# Patient Record
Sex: Female | Born: 1990 | Race: White | Hispanic: No | Marital: Single | Smoking: Former smoker
Health system: Southern US, Community
[De-identification: ages and names within clinical notes are randomized; demographics above are authoritative.]

## PROBLEM LIST (undated history)

## (undated) ENCOUNTER — Inpatient Hospital Stay (HOSPITAL_COMMUNITY): Payer: Self-pay

## (undated) DIAGNOSIS — F32A Depression, unspecified: Secondary | ICD-10-CM

## (undated) DIAGNOSIS — R519 Headache, unspecified: Secondary | ICD-10-CM

## (undated) DIAGNOSIS — O139 Gestational [pregnancy-induced] hypertension without significant proteinuria, unspecified trimester: Secondary | ICD-10-CM

## (undated) DIAGNOSIS — R51 Headache: Secondary | ICD-10-CM

## (undated) DIAGNOSIS — Z789 Other specified health status: Secondary | ICD-10-CM

## (undated) DIAGNOSIS — F329 Major depressive disorder, single episode, unspecified: Secondary | ICD-10-CM

## (undated) DIAGNOSIS — F419 Anxiety disorder, unspecified: Secondary | ICD-10-CM

## (undated) HISTORY — PX: WISDOM TOOTH EXTRACTION: SHX21

## (undated) HISTORY — PX: OTHER SURGICAL HISTORY: SHX169

## (undated) HISTORY — PX: INCISE AND DRAIN ABCESS: PRO64

## (undated) HISTORY — PX: DILATION AND CURETTAGE OF UTERUS: SHX78

## (undated) HISTORY — PX: ADENOIDECTOMY: SUR15

---
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
## 2011-01-08 ENCOUNTER — Encounter (INDEPENDENT_AMBULATORY_CARE_PROVIDER_SITE_OTHER): Payer: Medicaid Other | Admitting: Obstetrics & Gynecology

## 2011-01-08 DIAGNOSIS — E669 Obesity, unspecified: Secondary | ICD-10-CM

## 2011-01-08 DIAGNOSIS — Z3480 Encounter for supervision of other normal pregnancy, unspecified trimester: Secondary | ICD-10-CM

## 2011-01-15 ENCOUNTER — Encounter (INDEPENDENT_AMBULATORY_CARE_PROVIDER_SITE_OTHER): Payer: Medicaid Other | Admitting: Obstetrics and Gynecology

## 2011-01-15 DIAGNOSIS — Z3480 Encounter for supervision of other normal pregnancy, unspecified trimester: Secondary | ICD-10-CM

## 2011-01-21 ENCOUNTER — Encounter (INDEPENDENT_AMBULATORY_CARE_PROVIDER_SITE_OTHER): Payer: Medicaid Other | Admitting: Obstetrics & Gynecology

## 2011-01-21 DIAGNOSIS — E669 Obesity, unspecified: Secondary | ICD-10-CM

## 2011-01-21 DIAGNOSIS — Z3480 Encounter for supervision of other normal pregnancy, unspecified trimester: Secondary | ICD-10-CM

## 2011-01-24 ENCOUNTER — Inpatient Hospital Stay (HOSPITAL_COMMUNITY)
Admission: AD | Admit: 2011-01-24 | Discharge: 2011-01-24 | Disposition: A | Discharge status: Home / Self Care | Payer: Medicaid Other | Source: Ambulatory Visit | Attending: Obstetrics & Gynecology | Admitting: Obstetrics & Gynecology

## 2011-01-24 DIAGNOSIS — O479 False labor, unspecified: Secondary | ICD-10-CM | POA: Insufficient documentation

## 2011-01-25 ENCOUNTER — Inpatient Hospital Stay (HOSPITAL_COMMUNITY)
Admission: AD | Admit: 2011-01-25 | Discharge: 2011-01-25 | Disposition: A | Discharge status: Home / Self Care | Payer: Medicaid Other | Source: Ambulatory Visit | Attending: Obstetrics & Gynecology | Admitting: Obstetrics & Gynecology

## 2011-01-25 DIAGNOSIS — O479 False labor, unspecified: Secondary | ICD-10-CM | POA: Insufficient documentation

## 2011-01-26 ENCOUNTER — Encounter: Payer: Self-pay | Admitting: Obstetrics & Gynecology

## 2011-01-26 ENCOUNTER — Inpatient Hospital Stay (HOSPITAL_COMMUNITY)
Admission: AD | Admit: 2011-01-26 | Discharge: 2011-01-29 | DRG: 766 | Disposition: A | Discharge status: Home / Self Care | Payer: Medicaid Other | Source: Ambulatory Visit | Attending: Obstetrics & Gynecology | Admitting: Obstetrics & Gynecology

## 2011-01-26 ENCOUNTER — Other Ambulatory Visit: Payer: Self-pay | Admitting: Obstetrics & Gynecology

## 2011-01-26 LAB — CBC
HCT: 38.1 % (ref 36.0–46.0)
Hemoglobin: 12.8 g/dL (ref 12.0–15.0)
MCH: 29 pg (ref 26.0–34.0)
MCHC: 33.6 g/dL (ref 30.0–36.0)
MCV: 86.4 fL (ref 78.0–100.0)
Platelets: 338 10*3/uL (ref 150–400)
RBC: 4.41 MIL/uL (ref 3.87–5.11)

## 2011-01-26 LAB — RPR: RPR Ser Ql: NONREACTIVE

## 2011-01-27 LAB — CBC: MCV: 87.3 fL (ref 78.0–100.0)

## 2011-01-29 ENCOUNTER — Encounter: Payer: Medicaid Other | Admitting: Obstetrics & Gynecology

## 2011-01-29 NOTE — Op Note (Signed)
Caitlyn Galvan, Caitlyn Galvan         ACCOUNT NO.:  [id number]  MEDICAL RECORD NO.:  [id number]  LOCATION:  9102                          FACILITY:  [facility]  PHYSICIAN:  Lazaro Arms, M.D.   DATE OF BIRTH:  1991/07/08  DATE OF PROCEDURE:  01/26/2011 DATE OF DISCHARGE:                              OPERATIVE REPORT   PREOPERATIVE DIAGNOSES: 1. Intrauterine pregnancy at 39-6/7 weeks' gestation. 2. Secondary arrest of descend and dilatation in the first stage of     labor.  POSTOPERATIVE DIAGNOSES: 1. Intrauterine pregnancy at 39-6/7 weeks' gestation. 2. Secondary arrest of descend and dilatation in the first stage of     labor.  PROCEDURE:  Primary low transverse cesarean resection.  SURGEON:  Lazaro Arms, MD.  ANESTHESIA:  Epidural.  FINDINGS:  The patient had been essentially 5 cm for the past 4 hours by adequate labor.  Caitlyn Galvan called me at 1600 said she was 5, 100, -1 to - 2,  and she just got an adequate labor, probably about an hour so prior. I wanted to give her another couple of hours to give her good chance since the baby was stable.  We have told her re-notify me in 2 hours if cervical change.  At 1800 she called me and said there had been no appreciable cervical change and unfortunately she was then placed on her back to be examined.  The fetal heart rate came down to 60s and 70s that responded with some oxygen and changing position, however, her cervix had indeed not changed and she was 5, 100, about -2 and had a lot of caput and was asynclitic as a result I decided to proceed with primary low transverse cesarean resection.  We delivered a viable female at 32 with Apgars of 8 and 9 weighing 7 pounds 15 ounces.  Cord pH 7.22.  Uterus, tubes, and ovaries were normal.  DESCRIPTION OF OPERATION:  The patient was taken to the operating room, placed in the supine position.  Her epidural dosed up.  She was prepped and draped in usual sterile fashion.  A Foley was  already in place.  A Pfannenstiel skin incision was made and carried down sharply to the rectus fascia which was scored in the midline and extended laterally. The fascia was taken off the muscle superiorly and inferiorly without difficulty.  The muscles were divided.  Peritoneal cavity was entered. Bladder blade was placed. A low-transverse hysterotomy incision was made and over this incision was delivered a viable female infant at 69 with Apgars of  8 and 9 weighing 7 pounds 15 ounces.  Cord pH 7.22.  The infant underwent routine neonatal resuscitation by Dr. __________. Placenta was delivered spontaneously.  The uterus was exteriorized, wiped clean with a lap pad and closed in two layers first being running interlocking layer.  The second being imbricating layer with good hemostasis.  The uterus was replaced in peritoneal cavity was irrigated vigorously.  All pedicles hemostatic.  The muscles and peritoneum reapproximated loosely.  The fascia closed using 0 Vicryl running subcutaneous tissues made hemostatic and irrigated.  Skin was closed using 4-0 Vicryl in a subcuticular fashion and Dermabond.  The patient tolerated well.  She experienced 800  mL of blood loss, taken to recovery room in good stable condition.  All counts were correct x3.  She received Ancef prophylactically.     Lazaro Arms, M.D.     Loraine Maple  D:  01/26/2011  T:  01/27/2011  Job:  [id number]  Electronically Signed by Duane Lope M.D. on 01/29/2011 02:33:55 PM

## 2011-02-05 NOTE — Discharge Summary (Signed)
NAMERIVERLYN, Caitlyn Galvan         ACCOUNT NO.:  [id number]  MEDICAL RECORD NO.:  [id number]  LOCATION:  9102                          FACILITY:  [facility]  PHYSICIAN:  [name], MD        DATE OF BIRTH:  [birth year]  DATE OF ADMISSION:  01/26/2011 DATE OF DISCHARGE:  01/29/2011                              DISCHARGE SUMMARY   PRIMARY OBSTETRICAL CENTER:  [facility].  ADMISSION DIAGNOSES: 1. Intrauterine pregnancy at 19 and 6 weeks. 2. Spontaneous rupture of membranes. 3. Young maternal age. 4. Obesity. 5. History of Chlamydia.  DISCHARGE DIAGNOSES: 1. Intrauterine pregnancy at 10 and [redacted] weeks gestation. 2. Status post primary low transverse cesarean section for secondary     arrest of descent and dilation in the first stage of labor. 3. Young maternal age. 4. Obesity. 5. History of Chlamydia with this pregnancy.  DISCHARGE MEDICATIONS: 1. Motrin 600-800 mg p.o. q.6-8 h. p.r.n. 2. FeSO4 325 mg p.o. b.i.d. 3. Prenatal vitamin 1 tablet p.o. daily. 4. Colace 50-100 mg p.o. b.i.d. p.r.n. constipation.  CONSULTATIONS:  None.  PROCEDURES: 1. Primary low transverse cesarean section on January 26, 2011 for     secondary arrest of descent and dilation in the first stage of     labor.  The patient dilated to approximately 5 cm with adequate     labor and contractions, however, was not able to dilate past this.     At 1902, a viable female infant with Apgars of 8 and 9, weighing 7     pounds 15 ounces was delivered via C-section.  Cord pH was 7.22.     Uterus, tubes, and ovaries were all normal.  The fascia was closed     using 0-Vicryl and skin was closed using 4-0 Vicryl.  Dermabond was     used.  No staples were needed.  The patient tolerated the procedure     well.  Estimated blood loss was approximately 800 mL.  The patient     was taken to the recovery room in stable condition.  LABORATORY DATA:  On admission, the patient's CBC showed a hemoglobin of 12.8 and hematocrit 38.1.   Postop day #1, hemoglobin of 10.3 and hematocrit of 31.0.  The patient was RPR nonreactive.  BRIEF HOSPITAL COURSE:  This is a 20 year old now G1, P 1-0-0-1, status post primary low transverse cesarean section for secondary arrest of descent and dilation in the first stage of labor.  The patient was admitted for spontaneous rupture of membranes.  Pitocin was started as the patient was not contracting regularly despite adequate contractions as measured by an IUPC.  The patient could not dilate past 5 cm. Despite adequate labor, the patient was given time to dilate, however, after multiple hours of remaining 5 cm without any appreciable cervical change.  Fetal heart rate became bradycardic to the 60s-70s.  Heart rate responded with some oxygen and position change, however, since cervix had not changed and fetus had a lot of caput and asynclitic.  Primary low transverse cesarean section was performed.  A viable female infant at 7 pounds 15 ounces with Apgars of 8 and 9 was born at 42 with a cord pH of  7.22.  Mother and baby were stable and quickly transferred from the OR to PACU and then mother to baby.  Mother remained stable throughout hospital course and was doing well.  By postop day #3, she was ambulating, able to eat and drink, and had a bowel movement and was voiding spontaneously.  In addition, she had decreased lochia and pain was well-controlled with Motrin only.  DISCHARGE INSTRUCTIONS:  The patient was instructed to continue a regular diet and to follow instruction booklet for wound care, activity, and sexual activity.  The patient is to follow up at [facility] in 6 weeks for routine postpartum visit.  At that time, she will have Mirena IUD placed.  The patient was breast-feeding well at the time of discharge.  They will get an outpatient circumcision for the infant.  DISCHARGE CONDITION:  Mother and baby were discharged to home in stable medical  condition.    ______________________________ Caitlyn Pore, MD   ______________________________ [name], MD    JM/MEDQ  D:  01/29/2011  T:  01/29/2011  Job:  [id number]  Electronically Signed by Caitlyn Pore MD on 01/30/2011 01:39:35 PM Electronically Signed by [name] MD on 02/05/2011 08:04:07 AM

## 2011-03-04 ENCOUNTER — Encounter: Payer: Self-pay | Admitting: Family Medicine

## 2011-03-04 ENCOUNTER — Inpatient Hospital Stay (HOSPITAL_COMMUNITY)
Admission: AD | Admit: 2011-03-04 | Discharge: 2011-03-04 | Disposition: A | Discharge status: Home / Self Care | Payer: Medicaid Other | Source: Ambulatory Visit | Attending: Family Medicine | Admitting: Family Medicine

## 2011-03-04 DIAGNOSIS — N898 Other specified noninflammatory disorders of vagina: Secondary | ICD-10-CM

## 2011-03-04 DIAGNOSIS — N939 Abnormal uterine and vaginal bleeding, unspecified: Secondary | ICD-10-CM

## 2011-03-04 DIAGNOSIS — O26899 Other specified pregnancy related conditions, unspecified trimester: Secondary | ICD-10-CM | POA: Insufficient documentation

## 2011-03-04 HISTORY — DX: Other specified health status: Z78.9

## 2011-03-04 LAB — URINALYSIS, ROUTINE W REFLEX MICROSCOPIC
Glucose, UA: NEGATIVE mg/dL
Ketones, ur: NEGATIVE mg/dL
Leukocytes, UA: NEGATIVE
Nitrite: NEGATIVE
Specific Gravity, Urine: >1.03 — ABNORMAL HIGH (ref 1.005–1.030)

## 2011-03-04 LAB — URINE MICROSCOPIC-ADD ON

## 2011-03-04 LAB — CBC
Hemoglobin: 10.8 g/dL — ABNORMAL LOW (ref 12.0–15.0)
MCHC: 33 g/dL (ref 30.0–36.0)
MCV: 84.9 fL (ref 78.0–100.0)
Platelets: 323 10*3/uL (ref 150–400)
RBC: 3.85 MIL/uL — ABNORMAL LOW (ref 3.87–5.11)
RDW: 14.1 % (ref 11.5–15.5)
WBC: 12.2 10*3/uL — ABNORMAL HIGH (ref 4.0–10.5)

## 2011-03-04 MED ORDER — FERROUS SULFATE 325 (65 FE) MG PO TABS: 325.0000 mg | ORAL_TABLET | Freq: Two times a day (BID) | ORAL | Status: DC

## 2011-03-04 MED ORDER — NAPROXEN 500 MG PO TABS
500.0000 mg | ORAL_TABLET | Freq: Two times a day (BID) | ORAL | Status: AC | PRN
Start: 2011-03-04 — End: 2012-03-17

## 2011-03-04 NOTE — Progress Notes (Signed)
Pt states vaginal delivery 01/26/2011, lightened up postpartum, then restarted 2 days ago heavy. Changing pad 1-2x per hour, in triage scant amt on pad, however did just change 20 minutes ago. Deneis pain at present, no abnormal vaginal d/c. Did have intercourse 2-3 weeks ago.

## 2011-03-04 NOTE — ED Provider Notes (Addendum)
Chief Complaint:  Vaginal Bleeding   Caitlyn Galvan is  20 y.o. G1P1001 s/p PLTCS 01/26/11 due to FTP presenting with complaint of vaginal bleeding.  Patient's last menstrual period was 03/01/2011.[name]  Her pregnancy status is unknown/currently postpartum.  She presents complaining of Vaginal Bleeding  Pt states bleeding stopped after the delivery but then returned heavier, +clots x2 days.  No fever, chills, +nausea, no vomiting.  No incisional complaints.  +intercourse 2 weeks ago +condoms. Bottlefeeding.  No constipation.  Onset is described as sudden and has been present for  2 days.  Obstetrical/Gynecological History: OB History    Grav Para Term Preterm Abortions TAB SAB Ect Mult Living   1 1 1  0 0 0 0 0 0 1      Past Medical History: History reviewed. No pertinent past medical history.  Past Surgical History: Past Surgical History  Procedure Date  . Cesarean section     Family History: No family history on file.  Social History: History  Substance Use Topics  . Smoking status: Former Games developer  . Smokeless tobacco: Not on file  . Alcohol Use: No    Allergies: No Known Allergies  Prescriptions prior to admission  Medication Sig Dispense Refill  . ferrous sulfate 325 (65 FE) MG tablet Take 325 mg by mouth daily with breakfast.          Review of Systems - Negative except per HPI  Physical Exam   Blood pressure 120/70, pulse 87, temperature 98.1 F (36.7 C), temperature source Oral, resp. rate 16, height 5\' 4"  (1.626 m), weight 107.162 kg (236 lb 4 oz), last menstrual period 03/01/2011, unknown if currently breastfeeding.  General: General appearance - alert, well appearing, and in no distress Abdomen - soft, nontender, nondistended, no masses or organomegaly Incision well healed, c/d/i Focused Gynecological Exam: normal external genitalia, vulva, vagina, cervix, uterus and adnexa, blood in the vault, cervix appears closed, clots occasional, mild adnexal  tenderness to palpation, no uterine tenderness, difficult to palpate due to size.   Labs: Recent Results (from the past 24 hour(s))  URINALYSIS, ROUTINE W REFLEX MICROSCOPIC   Collection Time   03/04/11  5:27 PM      Component Value Range   Color, Urine YELLOW  YELLOW    Appearance HAZY (*) CLEAR    Specific Gravity, Urine >1.030 (*) 1.005 - 1.030    pH 5.5  5.0 - 8.0    Glucose, UA NEGATIVE  NEGATIVE (mg/dL)   Hgb urine dipstick LARGE (*) NEGATIVE    Bilirubin Urine NEGATIVE  NEGATIVE    Ketones, ur NEGATIVE  NEGATIVE (mg/dL)   Protein, ur NEGATIVE  NEGATIVE (mg/dL)   Urobilinogen, UA 0.2  0.0 - 1.0 (mg/dL)   Nitrite NEGATIVE  NEGATIVE    Leukocytes, UA NEGATIVE  NEGATIVE   PREGNANCY, URINE   Collection Time   03/04/11  5:27 PM      Component Value Range   Preg Test, Ur NEGATIVE    URINE MICROSCOPIC-ADD ON   Collection Time   03/04/11  5:27 PM      Component Value Range   Squamous Epithelial / LPF RARE  RARE    WBC, UA 0-2  <3 (WBC/hpf)   RBC / HPF TOO NUMEROUS TO COUNT  <3 (RBC/hpf)   Bacteria, UA FEW (*) RARE   CBC   Collection Time   03/04/11  5:52 PM      Component Value Range   WBC 12.2 (*) 4.0 - 10.5 (K/uL)  RBC 3.85 (*) 3.87 - 5.11 (MIL/uL)   Hemoglobin 10.8 (*) 12.0 - 15.0 (g/dL)   HCT 45.4 (*) 09.8 - 46.0 (%)   MCV 84.9  78.0 - 100.0 (fL)   MCH 28.1  26.0 - 34.0 (pg)   MCHC 33.0  30.0 - 36.0 (g/dL)   RDW 11.9  14.7 - 82.9 (%)   Platelets 323  150 - 400 (K/uL)  POCT PREGNANCY, URINE   Collection Time   03/04/11  6:05 PM      Component Value Range   Preg Test, Ur NEGATIVE     Imaging Studies:  No results found.   Assessment: 1. Vaginal bleeding likely normal menses.    Plan: 1. Labs reviewed. UPT  Neg, CBC stable continue po iron.  Start prn NSAIDS.  Pp contraception of an IUD at 6 week eval.  D/c home.   Richa Shor

## 2011-03-11 ENCOUNTER — Encounter: Payer: Self-pay | Admitting: Obstetrics and Gynecology

## 2011-03-11 ENCOUNTER — Ambulatory Visit (INDEPENDENT_AMBULATORY_CARE_PROVIDER_SITE_OTHER): Payer: Medicaid Other | Admitting: Obstetrics and Gynecology

## 2011-03-11 DIAGNOSIS — Z309 Encounter for contraceptive management, unspecified: Secondary | ICD-10-CM

## 2011-03-11 NOTE — Progress Notes (Signed)
Patient bled the entire post partum period.  She has stopped bleeding now.  Would like to have Mirena IUD placed for birth control.

## 2011-03-11 NOTE — Progress Notes (Signed)
  Subjective:    Patient ID: Caitlyn Galvan, female    DOB: [birth year], 20 y.o.   MRN: [id number]  HPI 20yo G1P1001 s/p primary LTCS on 6/24 secondary to failure to progress presenting today for postpartum visit. Patient is currently without any complaints. Reports receiving ample support and help from partner and denies si/sx of postpartum depression. Patient has initiated protected sexual activity and is interested in Mirena IUD for birth control. Patient is bottle feeding and reports infant is thriving. Patient reports persistent vaginal bleeding since giving birth which stopped yesterday. Patient states that initially it was heavy with clots (patient seen in MAU and told normal exam) but later had the appearance of a period. Patient is otherwise without any complaints.   Review of Systems  All other systems reviewed and are negative.       Objective:   Physical Exam GENERAL: Well-developed, well-nourished female in no acute distress.  HEENT: Normocephalic, atraumatic. Sclerae anicteric.  NECK: Supple. Normal thyroid.  LUNGS: Clear to auscultation bilaterally.  HEART: Regular rate and rhythm. BREASTS: Symmetric in size. No palpable masses or lymphadenopathy, no breast engorgement. ABDOMEN: Soft, nontender, nondistended. No organomegaly. Incision healed completely PELVIC: Normal external female genitalia. Vagina is pink and rugated.  Normal discharge. Normal appearing cervix. No abnormal bleeding. Uterus is normal in size.  No adnexal mass or tenderness. EXTREMITIES: No cyanosis, clubbing, or edema, 2+ distal pulses.       Assessment & Plan:  20yo G1P1001 s/p LTCS on 6/24 here for annual exam - Patient is medically cleared to resume all activities of daily living. - Patient to schedule Mirena IUD in a sertion appointment in the coming days. Patient to abstain from unprotected intercourse until insertion. - Patient to monitor vaginal bleeding

## 2011-03-11 NOTE — Patient Instructions (Signed)
  Place postpartum visit patient instructions here.  

## 2011-03-25 ENCOUNTER — Ambulatory Visit (INDEPENDENT_AMBULATORY_CARE_PROVIDER_SITE_OTHER): Payer: Medicaid Other | Admitting: Obstetrics & Gynecology

## 2011-03-25 ENCOUNTER — Encounter: Payer: Self-pay | Admitting: Obstetrics & Gynecology

## 2011-03-25 VITALS — BP 135/78 | HR 96 | Wt 243.0 lb

## 2011-03-25 DIAGNOSIS — Z30430 Encounter for insertion of intrauterine contraceptive device: Secondary | ICD-10-CM

## 2011-03-25 DIAGNOSIS — Z309 Encounter for contraceptive management, unspecified: Secondary | ICD-10-CM

## 2011-03-25 NOTE — Progress Notes (Signed)
  20yo G1P1001 s/p primary LTCS on 6/24 secondary to failure to progress presenting today for postpartum IUD insertion.  Had vaginal delivery.  No complaints.  IUD Procedure Note Patient identified, informed consent performed, signed copy in chart, time out was performed.  Speculum placed in the vagina.  Cervix visualized.  Cleaned with Betadine x 2.  Grasped anteriorly with a single tooth tenaculum.  Uterus sounded to 7.5 cm.  Mirena IUD placed per manufacturer's recommendations.  Strings trimmed to 3 cm. Tenaculum was removed, good hemostasis noted.  Patient tolerated procedure well.   Patient given post procedure instructions and Mirena care card with expiration date.  Patient is asked to check IUD strings periodically and follow up in 4-6 weeks for IUD check.  Caitlyn Galvan A 03/25/2011 11:29 AM

## 2011-03-25 NOTE — Patient Instructions (Signed)
IMPORTANT: HOW TO USE THIS INFORMATION:  This is a summary and does NOT have all possible information about this product. This information does not assure that this product is safe, effective, or appropriate for you. This information is not individual medical advice and does not substitute for the advice of your health care professional. Always ask your health care professional for complete information about this product and your specific health needs.    LEVONORGESTREL-RELEASING IMPLANT - INTRAUTERINE (lee-voh-nor-JEST-rell)    COMMON BRAND NAME(S): Mirena    USES:  This product is a small, flexible device that is placed in the womb (uterus) to prevent pregnancy. It is used in women who desire reversible birth control that works for a long time (up to 5 years). The device works by slowly releasing a hormone (levonorgestrel) that is similar to a certain substance made by a woman's body. This product is only intended for women who have previously given birth and have only one sexual partner. It is not meant for women with a history of certain infections/conditions (e.g., pelvic inflammatory disease, sexually transmitted disease, a certain problem pregnancy called ectopic pregnancy). For more information, consult your doctor. The use of this medication device does not protect you or your partner against sexually transmitted diseases (e.g., HIV, gonorrhea). Carefully read all of the information provided by your doctor, and ask any questions you may have about this product or other birth control methods that may be right for you.    HOW TO USE:  Read the Patient Information Leaflet provided by your pharmacist before this medication device is inserted and each time it is re-inserted. The leaflet contains very important information about side effects and when it is important to call your doctor. If you have any questions, consult your doctor or pharmacist. This product is inserted into your uterus by a properly  trained health care professional, usually once every 5 years or as determined by your doctor. The medication in the device is slowly released into the body over a 5-year period. Have a follow-up appointment 4-12 weeks after insertion of this product to check that it is still correctly in place. If you still desire birth control after 5 years, the medication device may be replaced with a new one. The medication device may also be removed at any time by a properly trained health care professional. Learn all the instructions on how and when to check this product and its proper positioning in your body, and make sure you understand the problems that may occur with this product. See also Precautions section.    SIDE EFFECTS:  Irregular vaginal bleeding (e.g., spotting), cramps, headache, nausea, breast pain, acne, rash, hair loss, weight gain, or decreased interest in sex may occur. If any of these effects persist or worsen, tell your doctor promptly. Remember that your doctor has prescribed this medication device because he or she has judged that the benefit to you is greater than the risk of side effects. Many people using this medication device do not have serious side effects. Tell your doctor immediately if any of these serious side effects occur: lack of menstrual period, unexplained fever, chills, trouble breathing, mental/mood changes (e.g., depression, nervousness), vaginal swelling/itching, painful intercourse. Tell your doctor immediately if any of these unlikely but serious side effects occur: migraine/severe headache, vomiting, tiredness, fast/pounding heartbeat. Tell your doctor immediately if any of these highly unlikely but very serious side effects occur: prolonged or heavy vaginal bleeding, unusual vaginal discharge/odor, vaginal sores, abdominal/pelvic pain or  tenderness, lumps in the breast, yellowing eyes/skin, dark urine, persistent nausea, trouble urinating. A very serious allergic reaction to  this drug is rare. However, seek immediate medical attention if you notice any of the following symptoms of a serious allergic reaction: rash, itching/swelling (especially of the face/tongue/throat), severe dizziness, trouble breathing. This is not a complete list of possible side effects. If you notice other effects not listed above, contact your doctor or pharmacist. In the [place] - Call your doctor for medical advice about side effects. You may report side effects to FDA at 1-800-FDA-1088. In [place] - Call your doctor for medical advice about side effects. You may report side effects to [facility] [place] at [phone].    PRECAUTIONS:  Before using this medication device, tell your doctor or pharmacist if you are allergic to levonorgestrel, or to any other progestins (e.g., norethindrone, desogestrel); or if you have any other allergies. This product may contain inactive ingredients, which can cause allergic reactions or other problems. Talk to your pharmacist for more details. This medication device should not be used if you have certain medical conditions. Before using this product, consult your doctor or pharmacist if you have: current known or suspected pregnancy, previous ectopic pregnancy, uterus problems (e.g., cancer, endometriosis, fibroids, pelvic inflammatory disease-PID), other IUD (intrauterine device) still in place, vaginal problems (e.g., infection), breast cancer, liver disease/tumors, any condition that affects your immune system (e.g., AIDS, leukemia). Before using this product, tell your doctor your medical history, especially of: bleeding problems (e.g., menstrual changes, clotting problems), heart problems (e.g., congenital valve conditions), high blood pressure, migraine headaches, stroke, diabetes. If you have diabetes, this medication may make it harder to control your blood sugar levels. Monitor your blood sugar regularly as directed by your doctor. Tell your doctor the results and any  symptoms such as increased thirst/urination. Your anti-diabetic medication or diet may need to be adjusted. This medication device may sometimes come out by itself or move out of place. This may result in unwanted pregnancy or other problems. After each menstrual period, check to make sure it is in the right place. Talk to your doctor about how to check your device. If it comes out or you cannot feel its threads, call your doctor promptly, and use a backup birth control method such as condoms. If you or partner has any other sexual partners, this medication device may no longer be a good choice for pregnancy prevention. If you or your partner becomes HIV positive, or if you think you may have been exposed to any sexually transmitted disease, contact your doctor immediately. You should consider having this device removed. This medication device must not be used during pregnancy. If you become pregnant or think you may be pregnant, tell your doctor immediately. If you have just given birth and are not breast-feeding, or if you have had a pregnancy loss or abortion after the 3 months of pregnancy, wait at least 6 weeks (or as directed by your doctor) before using this medication device. Consult your doctor about the problems that may occur during pregnancy while using this product. Levonorgestrel passes into breast milk. Consult your doctor before breast-feeding.    DRUG INTERACTIONS:  Your doctor or pharmacist may already be aware of any possible drug interactions and may be monitoring you for them. Do not start, stop, or change the dosage of any medicine before checking with them first. Before using this medication device, tell your doctor of all prescription and nonprescription medications you may use, especially  of: "blood thinners" (e.g., warfarin), birth control taken by mouth or applied to the skin (patch), certain drug used for varicose vein treatment (sodium tetradecyl sulfate), drugs that affect your immune  response (e.g., corticosteroids such as prednisone). This document does not contain all possible interactions. Therefore, before using this product, tell your doctor or pharmacist of all the products you use. Keep a list of all your medications with you, and share the list with your doctor and pharmacist.    OVERDOSE:  Overdose with this medication is very unlikely because of the way the drug is released from this device. Consult your doctor or pharmacist for more information.    NOTES:  Do not share this medication with others. Keep all appointments with your doctor and the laboratory. You should have regular complete physical exams including blood pressure, breast exam, pelvic exam, and screening for cervical cancer (Pap smear). Follow your doctor's instructions for examining your own breasts, and report any lumps immediately. Consult your doctor for more details.    MISSED DOSE:  Not applicable.    STORAGE:  Before use, store at room temperature at 77 degrees F (25 degrees C) away from light and moisture. Brief storage between 59-86 degrees F (15-30 degrees C) is permitted. Keep all medications and medical devices away from children and pets. Do not flush medications down the toilet or pour them into a drain unless instructed to do so. Properly discard this product when it is expired or no longer needed. Consult your pharmacist or local waste disposal company for more details about how to safely discard your product.    Information last revised June 2010 Copyright(c) 2010 [employer].

## 2011-04-22 ENCOUNTER — Ambulatory Visit: Payer: Medicaid Other | Admitting: Obstetrics & Gynecology

## 2011-04-22 DIAGNOSIS — Z3040 Encounter for surveillance of contraceptives, unspecified: Secondary | ICD-10-CM

## 2011-04-25 ENCOUNTER — Ambulatory Visit (HOSPITAL_COMMUNITY): Payer: Self-pay

## 2011-04-25 ENCOUNTER — Inpatient Hospital Stay (INDEPENDENT_AMBULATORY_CARE_PROVIDER_SITE_OTHER)
Admission: RE | Admit: 2011-04-25 | Discharge: 2011-04-25 | Disposition: A | Discharge status: Home / Self Care | Payer: Medicaid Other | Source: Ambulatory Visit | Attending: Family Medicine | Admitting: Family Medicine

## 2011-04-25 DIAGNOSIS — H81399 Other peripheral vertigo, unspecified ear: Secondary | ICD-10-CM

## 2011-04-25 DIAGNOSIS — H9209 Otalgia, unspecified ear: Secondary | ICD-10-CM

## 2011-04-25 LAB — POCT URINALYSIS DIP (DEVICE)
Glucose, UA: 100 mg/dL — AB
Ketones, ur: NEGATIVE mg/dL
Nitrite: NEGATIVE
Specific Gravity, Urine: >=1.03 (ref 1.005–1.030)
Urobilinogen, UA: 1 mg/dL (ref 0.0–1.0)
pH: 5.5 (ref 5.0–8.0)

## 2011-04-25 LAB — POCT I-STAT, CHEM 8
BUN: 6 mg/dL (ref 6–23)
Creatinine, Ser: 0.7 mg/dL (ref 0.50–1.10)
Glucose, Bld: 87 mg/dL (ref 70–99)
Hemoglobin: 11.9 g/dL — ABNORMAL LOW (ref 12.0–15.0)
Potassium: 3.7 mEq/L (ref 3.5–5.1)
Sodium: 140 mEq/L (ref 135–145)

## 2013-03-09 ENCOUNTER — Emergency Department (INDEPENDENT_AMBULATORY_CARE_PROVIDER_SITE_OTHER)
Admission: EM | Admit: 2013-03-09 | Discharge: 2013-03-09 | Disposition: A | Discharge status: Home / Self Care | Payer: Self-pay | Source: Home / Self Care

## 2013-03-09 ENCOUNTER — Encounter (HOSPITAL_COMMUNITY): Payer: Self-pay

## 2013-03-09 DIAGNOSIS — L255 Unspecified contact dermatitis due to plants, except food: Secondary | ICD-10-CM

## 2013-03-09 DIAGNOSIS — L237 Allergic contact dermatitis due to plants, except food: Secondary | ICD-10-CM

## 2013-03-09 MED ORDER — PREDNISONE 10 MG PO KIT: PACK | ORAL | Status: DC

## 2013-03-09 NOTE — ED Notes (Signed)
Diffuse red raised itchy rash

## 2013-03-09 NOTE — ED Provider Notes (Signed)
Caitlyn Galvan is a 22 y.o. female who presents to Urgent Care today for poison ivy dermatitis occurring yesterday. Patient spends time outdoors in the yard and was exposed to poison ivy. She notes an itchy rash in linear streaks on her chest left leg and both arms. She denies any fevers or chills. The rash is quite itchy. She's used calamine ointment which has helped a bit. No nausea vomiting or diarrhea.    PMH reviewed. Healthy otherwise History  Substance Use Topics  . Smoking status: Current Every Day Smoker    Types: Cigarettes  . Smokeless tobacco: Not on file     Comment: 1/2 pack a week  . Alcohol Use: No   ROS as above Medications reviewed. No current facility-administered medications for this encounter.   Current Outpatient Prescriptions  Medication Sig Dispense Refill  . ferrous sulfate 325 (65 FE) MG tablet Take 1 tablet (325 mg total) by mouth 2 (two) times daily with a meal.  60 tablet  1  . ibuprofen (ADVIL,MOTRIN) 200 MG tablet Take 600 mg by mouth every 6 (six) hours as needed. As needed for pain       . mineral oil-hydrophilic petrolatum (AQUAPHOR) ointment Apply 1 application topically as needed. As needed for dry skin       . naphazoline (CLEAR EYES) 0.012 % ophthalmic solution Place 1 drop into both eyes daily. As needed for itching       . PredniSONE 10 MG KIT 12 day dose pack po  1 kit  0  . Prenatal Vitamins (DIS) TABS Take 1 tablet by mouth daily.          Exam:  BP 124/82  Pulse 90  Temp(Src) 98.9 F (37.2 C) (Oral)  Resp 18  SpO2 100% Gen: Well NAD Skin: Erythematous linear streaks on chest right lower leg and both arms with occasional vesicles. Nontender  No results found for this or any previous visit (from the past 24 hour(s)). No results found.  Assessment and Plan: 22 y.o. female with poison ivy dermatitis.  Plan to treat with prednisone Dosepak and Gold Bond Itch.  Discussed warning signs or symptoms. Please see discharge  instructions. Patient expresses understanding.      Rodolph Bong, MD 03/09/13 2020

## 2014-06-05 ENCOUNTER — Encounter (HOSPITAL_COMMUNITY): Payer: Self-pay

## 2014-07-29 ENCOUNTER — Encounter (HOSPITAL_COMMUNITY): Payer: Self-pay | Admitting: Emergency Medicine

## 2014-07-29 ENCOUNTER — Other Ambulatory Visit (HOSPITAL_COMMUNITY)
Admission: RE | Admit: 2014-07-29 | Discharge: 2014-07-29 | Disposition: A | Discharge status: Home / Self Care | Payer: Self-pay | Source: Ambulatory Visit | Attending: Family Medicine | Admitting: Family Medicine

## 2014-07-29 ENCOUNTER — Emergency Department (INDEPENDENT_AMBULATORY_CARE_PROVIDER_SITE_OTHER)
Admission: EM | Admit: 2014-07-29 | Discharge: 2014-07-29 | Disposition: A | Discharge status: Home / Self Care | Payer: Self-pay | Source: Home / Self Care | Attending: Family Medicine | Admitting: Family Medicine

## 2014-07-29 DIAGNOSIS — R102 Pelvic and perineal pain: Secondary | ICD-10-CM

## 2014-07-29 DIAGNOSIS — N898 Other specified noninflammatory disorders of vagina: Secondary | ICD-10-CM

## 2014-07-29 DIAGNOSIS — N760 Acute vaginitis: Secondary | ICD-10-CM | POA: Insufficient documentation

## 2014-07-29 DIAGNOSIS — N730 Acute parametritis and pelvic cellulitis: Secondary | ICD-10-CM

## 2014-07-29 DIAGNOSIS — Z113 Encounter for screening for infections with a predominantly sexual mode of transmission: Secondary | ICD-10-CM | POA: Insufficient documentation

## 2014-07-29 LAB — POCT URINALYSIS DIP (DEVICE)
Bilirubin Urine: NEGATIVE
GLUCOSE, UA: NEGATIVE mg/dL
Hgb urine dipstick: NEGATIVE
Ketones, ur: NEGATIVE mg/dL
Leukocytes, UA: NEGATIVE
Nitrite: NEGATIVE
PROTEIN: NEGATIVE mg/dL
SPECIFIC GRAVITY, URINE: 1.02 (ref 1.005–1.030)
Urobilinogen, UA: 0.2 mg/dL (ref 0.0–1.0)
pH: 7 (ref 5.0–8.0)

## 2014-07-29 LAB — POCT PREGNANCY, URINE: PREG TEST UR: NEGATIVE

## 2014-07-29 MED ORDER — LIDOCAINE HCL (PF) 1 % IJ SOLN
INTRAMUSCULAR | Status: AC
  Filled 2014-07-29: qty 5

## 2014-07-29 MED ORDER — CEFTRIAXONE SODIUM 250 MG IJ SOLR
250.0000 mg | Freq: Once | INTRAMUSCULAR | Status: AC
  Administered 2014-07-29: 250 mg via INTRAMUSCULAR

## 2014-07-29 MED ORDER — METRONIDAZOLE 500 MG PO TABS
500.0000 mg | ORAL_TABLET | Freq: Two times a day (BID) | ORAL | Status: DC
Start: 2014-07-29 — End: 2014-10-17

## 2014-07-29 MED ORDER — CEFTRIAXONE SODIUM 250 MG IJ SOLR
INTRAMUSCULAR | Status: AC
  Filled 2014-07-29: qty 250

## 2014-07-29 MED ORDER — AZITHROMYCIN 500 MG PO TABS: ORAL_TABLET | ORAL | Status: DC

## 2014-07-29 NOTE — ED Provider Notes (Signed)
CSN: [id number]     Arrival date & time 07/29/14  1818 History   First MD Initiated Contact with Patient 07/29/14 1909     Chief Complaint  Patient presents with  . Vaginal Pain   (Consider location/radiation/quality/duration/timing/severity/associated sxs/prior Treatment) HPI Comments: 23 year old obese female complaining of vaginal pain further described as pain deep in the vagina and within the cervix. This began approximately 5:30 PM and lasted approximately one and half hours. She is waiting in the room she is not having pain. She had an IUD placed approximately 3 years ago. She denies bleeding or vaginal discharge.   Past Medical History  Diagnosis Date  . No pertinent past medical history    Past Surgical History  Procedure Laterality Date  . Cesarean section    . Wisdom tooth extraction     No family history on file. History  Substance Use Topics  . Smoking status: Current Every Day Smoker    Types: Cigarettes  . Smokeless tobacco: Not on file     Comment: 1/2 pack a week  . Alcohol Use: No   OB History    Gravida Para Term Preterm AB TAB SAB Ectopic Multiple Living   _0 0 0 0 0 0 0 1     Review of Systems  Constitutional: Negative for fever and activity change.  Respiratory: Negative for cough and shortness of breath.   Cardiovascular: Negative for chest pain.  Gastrointestinal: Negative.   Genitourinary: Positive for vaginal pain and pelvic pain. Negative for dysuria, urgency, flank pain, vaginal discharge and menstrual problem.  Musculoskeletal: Negative.   Psychiatric/Behavioral: Negative.     Allergies  Review of patient's allergies indicates no known allergies.  Home Medications   Prior to Admission medications   Medication Sig Start Date End Date Taking? Authorizing Provider  azithromycin (ZITHROMAX) 500 MG tablet Take 2 tablets po stat 07/29/14   Janne Napoleon, NP  ferrous sulfate 325 (65 FE) MG tablet Take 1 tablet (325 mg total) by mouth 2 (two)  times daily with a meal. 03/04/11   Willaim Bane, MD  ibuprofen (ADVIL,MOTRIN) 200 MG tablet Take 600 mg by mouth every 6 (six) hours as needed. As needed for pain     Historical Provider, MD  metroNIDAZOLE (FLAGYL) 500 MG tablet Take 1 tablet (500 mg total) by mouth 2 (two) times daily. X 7 days 07/29/14   Janne Napoleon, NP  mineral oil-hydrophilic petrolatum (AQUAPHOR) ointment Apply 1 application topically as needed. As needed for dry skin     Historical Provider, MD  naphazoline (CLEAR EYES) 0.012 % ophthalmic solution Place 1 drop into both eyes daily. As needed for itching     Historical Provider, MD  PredniSONE 10 MG KIT 12 day dose pack po 03/09/13   Gregor Hams, MD  Prenatal Vitamins (DIS) TABS Take 1 tablet by mouth daily.      Historical Provider, MD   BP 124/82 mmHg  Pulse 81  Temp(Src) 98.5 F (36.9 C) (Oral)  Resp 16  SpO2 99%  LMP 07/15/2014 Physical Exam  Constitutional: She is oriented to person, place, and time. She appears well-developed and well-nourished. No distress.  Neck: Normal range of motion. Neck supple.  Cardiovascular: Normal rate.   Pulmonary/Chest: Effort normal. No respiratory distress.  Abdominal: Soft. She exhibits no mass. There is no tenderness. There is no rebound and no guarding.  Genitourinary:  Normal external female genitalia. Moderate amount of thin white discharge. Cervix is midline and posterior. Ectocervix  with abrasive appearance at the 6:00 position. No bleeding is seen. No lesions. Positive CMT, positive right adnexal tenderness negative left adnexal tenderness. Note that the IUD strings are extending from the os by about 3 cm.  Neurological: She is alert and oriented to person, place, and time.  Skin: Skin is warm and dry.  Psychiatric: She has a normal mood and affect.  Nursing note and vitals reviewed.   ED Course  Procedures (including critical care time) Labs Review Labs Reviewed  POCT URINALYSIS DIP (DEVICE)  POCT  PREGNANCY, URINE  CERVICOVAGINAL ANCILLARY ONLY   Results for orders placed or performed during the hospital encounter of 07/29/14  POCT urinalysis dip (device)  Result Value Ref Range   Glucose, UA NEGATIVE NEGATIVE mg/dL   Bilirubin Urine NEGATIVE NEGATIVE   Ketones, ur NEGATIVE NEGATIVE mg/dL   Specific Gravity, Urine 1.020 1.005 - 1.030   Hgb urine dipstick NEGATIVE NEGATIVE   pH 7.0 5.0 - 8.0   Protein, ur NEGATIVE NEGATIVE mg/dL   Urobilinogen, UA 0.2 0.0 - 1.0 mg/dL   Nitrite NEGATIVE NEGATIVE   Leukocytes, UA NEGATIVE NEGATIVE  Pregnancy, urine POC  Result Value Ref Range   Preg Test, Ur NEGATIVE NEGATIVE    Imaging Review No results found.   MDM   1. PID (acute pelvic inflammatory disease)   2. Pelvic pain in female   3. Vaginal discharge    Rocephin 250 mg IM now Rx flagyl 500 mg and azithromycin 1 gm po Cytology pending Instructions on above See gyn or PCP re IUD    Janne Napoleon, NP 07/29/14 2006

## 2014-07-29 NOTE — ED Notes (Signed)
Patient aware that there is a post injection delay prior to being discharged after im injection

## 2014-07-29 NOTE — Discharge Instructions (Signed)
Pelvic Inflammatory Disease °Pelvic inflammatory disease (PID) refers to an infection in some or all of the female organs. The infection can be in the uterus, ovaries, fallopian tubes, or the surrounding tissues in the pelvis. PID can cause abdominal or pelvic pain that comes on suddenly (acute pelvic pain). PID is a serious infection because it can lead to lasting (chronic) pelvic pain or the inability to have children (infertile).  °CAUSES  °The infection is often caused by the normal bacteria found in the vaginal tissues. PID may also be caused by an infection that is spread during sexual contact. PID can also occur following:  °· The birth of a baby.   °· A miscarriage.   °· An abortion.   °· Major pelvic surgery.   °· The use of an intrauterine device (IUD).   °· A sexual assault.   °RISK FACTORS °Certain factors can put a person at higher risk for PID, such as: °· Being younger than 25 years. °· Being sexually active at a young age. °· Using nonbarrier contraception. °· Having multiple sexual partners. °· Having sex with someone who has symptoms of a genital infection. °· Using oral contraception. °Other times, certain behaviors can increase the possibility of getting PID, such as: °· Having sex during your period. °· Using a vaginal douche. °· Having an intrauterine device (IUD) in place. °SYMPTOMS  °· Abdominal or pelvic pain.   °· Fever.   °· Chills.   °· Abnormal vaginal discharge. °· Abnormal uterine bleeding.   °· Unusual pain shortly after finishing your period. °DIAGNOSIS  °Your caregiver will choose some of the following methods to make a diagnosis, such as:  °· Performing a physical exam and history. A pelvic exam typically reveals a very tender uterus and surrounding pelvis.   °· Ordering laboratory tests including a pregnancy test, blood tests, and urine test.  °· Ordering cultures of the vagina and cervix to check for a sexually transmitted infection (STI). °· Performing an ultrasound.    °· Performing a laparoscopic procedure to look inside the pelvis.   °TREATMENT  °· Antibiotic medicines may be prescribed and taken by mouth.   °· Sexual partners may be treated when the infection is caused by a sexually transmitted disease (STD).   °· Hospitalization may be needed to give antibiotics intravenously. °· Surgery may be needed, but this is rare. °It may take weeks until you are completely well. If you are diagnosed with PID, you should also be checked for human immunodeficiency virus (HIV).   °HOME CARE INSTRUCTIONS  °· If given, take your antibiotics as directed. Finish the medicine even if you start to feel better.   °· Only take over-the-counter or prescription medicines for pain, discomfort, or fever as directed by your caregiver.   °· Do not have sexual intercourse until treatment is completed or as directed by your caregiver. If PID is confirmed, your recent sexual partner(s) will need treatment.   °· Keep your follow-up appointments. °SEEK MEDICAL CARE IF:  °· You have increased or abnormal vaginal discharge.   °· You need prescription medicine for your pain.   °· You vomit.   °· You cannot take your medicines.   °· Your partner has an STD.   °SEEK IMMEDIATE MEDICAL CARE IF:  °· You have a fever.   °· You have increased abdominal or pelvic pain.   °· You have chills.   °· You have pain when you urinate.   °· You are not better after 72 hours following treatment.   °MAKE SURE YOU:  °· Understand these instructions. °· Will watch your condition. °· Will get help right away if you are not doing well or get worse. °  Document Released: 07/21/2005 Document Revised: 11/15/2012 Document Reviewed: 07/17/2011 [facility] Patient Information 2015 [place], [place]. This information is not intended to replace advice given to you by your health care provider. Make sure you discuss any questions you have with your health care provider.  Pelvic Pain Female pelvic pain can be caused by many different things and  start from a variety of places. Pelvic pain refers to pain that is located in the lower half of the abdomen and between your hips. The pain may occur over a short period of time (acute) or may be reoccurring (chronic). The cause of pelvic pain may be related to disorders affecting the female reproductive organs (gynecologic), but it may also be related to the bladder, kidney stones, an intestinal complication, or muscle or skeletal problems. Getting help right away for pelvic pain is important, especially if there has been severe, sharp, or a sudden onset of unusual pain. It is also important to get help right away because some types of pelvic pain can be life threatening.  CAUSES  Below are only some of the causes of pelvic pain. The causes of pelvic pain can be in one of several categories.   Gynecologic.  Pelvic inflammatory disease.  Sexually transmitted infection.  Ovarian cyst or a twisted ovarian ligament (ovarian torsion).  Uterine lining that grows outside the uterus (endometriosis).  Fibroids, cysts, or tumors.  Ovulation.  Pregnancy.  Pregnancy that occurs outside the uterus (ectopic pregnancy).  Miscarriage.  Labor.  Abruption of the placenta or ruptured uterus.  Infection.  Uterine infection (endometritis).  Bladder infection.  Diverticulitis.  Miscarriage related to a uterine infection (septic abortion).  Bladder.  Inflammation of the bladder (cystitis).  Kidney stone(s).  Gastrointestinal.  Constipation.  Diverticulitis.  Neurologic.  Trauma.  Feeling pelvic pain because of mental or emotional causes (psychosomatic).  Cancers of the bowel or pelvis. EVALUATION  Your caregiver will want to take a careful history of your concerns. This includes recent changes in your health, a careful gynecologic history of your periods (menses), and a sexual history. Obtaining your family history and medical history is also important. Your caregiver may suggest  a pelvic exam. A pelvic exam will help identify the location and severity of the pain. It also helps in the evaluation of which organ system may be involved. In order to identify the cause of the pelvic pain and be properly treated, your caregiver may order tests. These tests may include:   A pregnancy test.  Pelvic ultrasonography.  An X-ray exam of the abdomen.  A urinalysis or evaluation of vaginal discharge.  Blood tests. HOME CARE INSTRUCTIONS   Only take over-the-counter or prescription medicines for pain, discomfort, or fever as directed by your caregiver.   Rest as directed by your caregiver.   Eat a balanced diet.   Drink enough fluids to make your urine clear or pale yellow, or as directed.   Avoid sexual intercourse if it causes pain.   Apply warm or cold compresses to the lower abdomen depending on which one helps the pain.   Avoid stressful situations.   Keep a journal of your pelvic pain. Write down when it started, where the pain is located, and if there are things that seem to be associated with the pain, such as food or your menstrual cycle.  Follow up with your caregiver as directed.  SEEK MEDICAL CARE IF:  Your medicine does not help your pain.  You have abnormal vaginal discharge. [place]  IF:   You have heavy bleeding from the vagina.   Your pelvic pain increases.   You feel light-headed or faint.   You have chills.   You have pain with urination or blood in your urine.   You have uncontrolled diarrhea or vomiting.   You have a fever or persistent symptoms for more than 3 days.  You have a fever and your symptoms suddenly get worse.   You are being physically or sexually abused.  MAKE SURE YOU:  Understand these instructions.  Will watch your condition.  Will get help if you are not doing well or get worse. Document Released: 06/17/2004 Document Revised: 12/05/2013 Document Reviewed:  11/10/2011 [facility] Patient Information 2015 [place], [place]. This information is not intended to replace advice given to you by your health care provider. Make sure you discuss any questions you have with your health care provider.

## 2014-07-29 NOTE — ED Notes (Signed)
Vaginal pain that started 3 days ago, then went away.  Reports pain reoccured when getting out of shower.  Described as sharp pian through vagina.  Denies vaginal discharge. Denies having sex recently.  Patient does have a iud

## 2014-07-31 LAB — CERVICOVAGINAL ANCILLARY ONLY
CHLAMYDIA, DNA PROBE: NEGATIVE
NEISSERIA GONORRHEA: NEGATIVE
WET PREP (BD AFFIRM): NEGATIVE
WET PREP (BD AFFIRM): POSITIVE — AB
Wet Prep (BD Affirm): NEGATIVE

## 2014-08-01 NOTE — ED Notes (Signed)
GC/Chlamydia neg., Affirm: Candida and Trich neg., Gardnerella pos.  Pt. adequately treated with Flagyl. Caitlyn Galvan, Caitlyn Galvan 08/01/2014

## 2014-09-04 ENCOUNTER — Emergency Department (INDEPENDENT_AMBULATORY_CARE_PROVIDER_SITE_OTHER)
Admission: EM | Admit: 2014-09-04 | Discharge: 2014-09-04 | Disposition: A | Discharge status: Home / Self Care | Payer: Self-pay | Source: Home / Self Care | Attending: Family Medicine | Admitting: Family Medicine

## 2014-09-04 ENCOUNTER — Encounter (HOSPITAL_COMMUNITY): Payer: Self-pay | Admitting: *Deleted

## 2014-09-04 ENCOUNTER — Emergency Department (HOSPITAL_COMMUNITY)
Admission: EM | Admit: 2014-09-04 | Discharge: 2014-09-04 | Discharge status: Absent without leave | Payer: Self-pay | Source: Home / Self Care

## 2014-09-04 DIAGNOSIS — H66001 Acute suppurative otitis media without spontaneous rupture of ear drum, right ear: Secondary | ICD-10-CM

## 2014-09-04 MED ORDER — AMOXICILLIN 875 MG PO TABS: 875.0000 mg | ORAL_TABLET | Freq: Two times a day (BID) | ORAL | Status: DC

## 2014-09-04 NOTE — Discharge Instructions (Signed)
You have a cold which has led to your ear infection. Your ear looks so severe that it may have been having problems without you knowing it for some time.   I would also recommend using nasal saline to help your eustachian tube drain.   Be sure to finish your antibiotics.   Otitis Media Otitis media is redness, soreness, and inflammation of the middle ear. Otitis media may be caused by allergies or, most commonly, by infection. Often it occurs as a complication of the common cold. SIGNS AND SYMPTOMS Symptoms of otitis media may include:  Earache.  Fever.  Ringing in your ear.  Headache.  Leakage of fluid from the ear. DIAGNOSIS To diagnose otitis media, your health care provider will examine your ear with an otoscope. This is an instrument that allows your health care provider to see into your ear in order to examine your eardrum. Your health care provider also will ask you questions about your symptoms. TREATMENT  Typically, otitis media resolves on its own within 3-5 days. Your health care provider may prescribe medicine to ease your symptoms of pain. If otitis media does not resolve within 5 days or is recurrent, your health care provider may prescribe antibiotic medicines if he or she suspects that a bacterial infection is the cause. HOME CARE INSTRUCTIONS   If you were prescribed an antibiotic medicine, finish it all even if you start to feel better.  Take medicines only as directed by your health care provider.  Keep all follow-up visits as directed by your health care provider. SEEK MEDICAL CARE IF:  You have otitis media only in one ear, or bleeding from your nose, or both.  You notice a lump on your neck.  You are not getting better in 3-5 days.  You feel worse instead of better. SEEK IMMEDIATE MEDICAL CARE IF:   You have pain that is not controlled with medicine.  You have swelling, redness, or pain around your ear or stiffness in your neck.  You notice that  part of your face is paralyzed.  You notice that the bone behind your ear (mastoid) is tender when you touch it. MAKE SURE YOU:   Understand these instructions.  Will watch your condition.  Will get help right away if you are not doing well or get worse. Document Released: 04/25/2004 Document Revised: 12/05/2013 Document Reviewed: 02/15/2013 [facility] Patient Information 2015 [place], [place]. This information is not intended to replace advice given to you by your health care provider. Make sure you discuss any questions you have with your health care provider.

## 2014-09-04 NOTE — ED Provider Notes (Signed)
CSN: [id number]     Arrival date & time 09/04/14  1455 History   First MD Initiated Contact with Patient 09/04/14 1623     Chief Complaint  Patient presents with  . URI   (Consider location/radiation/quality/duration/timing/severity/associated sxs/prior Treatment) HPI   Patient here complaining of family history of muffled hearing in right ear, right ear pain, rhinorrhea, conjunctival injection of the right eye, and sore throat. She states that her symptoms began with rhinorrhea and sore throat have progressed to the ear pain and muffled hearing. Denies fever, chills, sweats. She states that food doesn't taste the way that usually does, and her appetite is decreased.  She's a history of ear infections and she was a child and had PE tubes and time. She's not had an ear infection for several years now. She also has a history of strep pharyngitis without any recent infection.   Past Medical History  Diagnosis Date  . No pertinent past medical history    Past Surgical History  Procedure Laterality Date  . Cesarean section    . Wisdom tooth extraction     History reviewed. No pertinent family history. History  Substance Use Topics  . Smoking status: Current Every Day Smoker    Types: Cigarettes  . Smokeless tobacco: Not on file     Comment: 1/2 pack a week  . Alcohol Use: No   OB History    Gravida Para Term Preterm AB TAB SAB Ectopic Multiple Living   '1 1 1 ' 0 0 0 0 0 0 1     Review of Systems  Constitutional: Positive for appetite change. Negative for fever, chills and activity change.  HENT: Positive for congestion, hearing loss and rhinorrhea.   Eyes: Positive for redness.  Respiratory: Negative for shortness of breath.   Cardiovascular: Negative for chest pain.  Gastrointestinal: Negative for nausea.    Allergies  Review of patient's allergies indicates no known allergies.  Home Medications   Prior to Admission medications   Medication Sig Start Date End Date  Taking? Authorizing Provider  amoxicillin (AMOXIL) 875 MG tablet Take 1 tablet (875 mg total) by mouth 2 (two) times daily. 09/04/14   Timmothy Euler, MD  azithromycin (ZITHROMAX) 500 MG tablet Take 2 tablets po stat 07/29/14   Janne Napoleon, NP  ferrous sulfate 325 (65 FE) MG tablet Take 1 tablet (325 mg total) by mouth 2 (two) times daily with a meal. 03/04/11   Willaim Bane, MD  ibuprofen (ADVIL,MOTRIN) 200 MG tablet Take 600 mg by mouth every 6 (six) hours as needed. As needed for pain     Historical Provider, MD  metroNIDAZOLE (FLAGYL) 500 MG tablet Take 1 tablet (500 mg total) by mouth 2 (two) times daily. X 7 days 07/29/14   Janne Napoleon, NP  mineral oil-hydrophilic petrolatum (AQUAPHOR) ointment Apply 1 application topically as needed. As needed for dry skin     Historical Provider, MD  naphazoline (CLEAR EYES) 0.012 % ophthalmic solution Place 1 drop into both eyes daily. As needed for itching     Historical Provider, MD  PredniSONE 10 MG KIT 12 day dose pack po 03/09/13   Gregor Hams, MD  Prenatal Vitamins (DIS) TABS Take 1 tablet by mouth daily.      Historical Provider, MD   BP 124/85 mmHg  Pulse 71  Temp(Src) 98.2 F (36.8 C) (Oral)  Resp 18  SpO2 100%  LMP 08/04/2014 Physical Exam  Constitutional: She is oriented to person, place, and  time. She appears well-developed and well-nourished. No distress.  HENT:  Head: Normocephalic and atraumatic.  Left Ear: External ear normal.  Nose: Nose normal.  Mouth/Throat: No oropharyngeal exudate.  Right TM erythematous with pus visible behind the TM, oropharynx with swollen tonsils without exudates bilaterally  Eyes: Right eye exhibits no discharge. Left eye exhibits no discharge.  Injected conjunctiva of the right eye, no discharge  Neck: Neck supple.  Cardiovascular: Normal rate and regular rhythm.   No murmur heard. Pulmonary/Chest: Effort normal and breath sounds normal. No respiratory distress. She has no wheezes.   Lymphadenopathy:    She has cervical adenopathy.  Neurological: She is alert and oriented to person, place, and time.  Skin: No rash noted. She is not diaphoretic.    ED Course  Procedures (including critical care time) Labs Review Labs Reviewed - No data to display  Imaging Review No results found.   MDM   1. Acute suppurative otitis media of right ear without spontaneous rupture of tympanic membrane, recurrence not specified    24 year old female here with viral URI symptoms with acute separative otitis media the right ear. Patient is nontoxic-appearing and well-hydrated. Considering the appearance and severity of her ear infection we recommended that she follow-up in 7-10 days with ENT.  She is given prescription for amoxicillin to treat the ear infection.   Pt seen and discussed with Dr. Juventino Slovak and he agrees with the plan as discussed above.      Timmothy Euler, MD 09/04/14 [phone]

## 2014-09-04 NOTE — ED Notes (Signed)
Pt  Reports  Symptoms   Of  sorethroat     With  Cough  /  Congested      sorethroat     Red  Runny     Eyes        Pt  Also  Reports  Bilateral  Earache  As   Well   -  The  Cough is  Non productive        -  The pt  Is  Sitting  Upright on  The  Exam  Table in no  Acute  Distress

## 2014-09-04 NOTE — ED Provider Notes (Signed)
CSN: [id number]     Arrival date & time 09/04/14  1455 History   First MD Initiated Contact with Patient 09/04/14 1623     Chief Complaint  Patient presents with  . URI   (Consider location/radiation/quality/duration/timing/severity/associated sxs/prior Treatment) HPI  Past Medical History  Diagnosis Date  . No pertinent past medical history    Past Surgical History  Procedure Laterality Date  . Cesarean section    . Wisdom tooth extraction     History reviewed. No pertinent family history. History  Substance Use Topics  . Smoking status: Current Every Day Smoker    Types: Cigarettes  . Smokeless tobacco: Not on file     Comment: 1/2 pack a week  . Alcohol Use: No   OB History    Gravida Para Term Preterm AB TAB SAB Ectopic Multiple Living   '1 1 1 ' 0 0 0 0 0 0 1     Review of Systems  Allergies  Review of patient's allergies indicates no known allergies.  Home Medications   Prior to Admission medications   Medication Sig Start Date End Date Taking? Authorizing Provider  azithromycin (ZITHROMAX) 500 MG tablet Take 2 tablets po stat 07/29/14   Janne Napoleon, NP  ferrous sulfate 325 (65 FE) MG tablet Take 1 tablet (325 mg total) by mouth 2 (two) times daily with a meal. 03/04/11   Willaim Bane, MD  ibuprofen (ADVIL,MOTRIN) 200 MG tablet Take 600 mg by mouth every 6 (six) hours as needed. As needed for pain     Historical Provider, MD  metroNIDAZOLE (FLAGYL) 500 MG tablet Take 1 tablet (500 mg total) by mouth 2 (two) times daily. X 7 days 07/29/14   Janne Napoleon, NP  mineral oil-hydrophilic petrolatum (AQUAPHOR) ointment Apply 1 application topically as needed. As needed for dry skin     Historical Provider, MD  naphazoline (CLEAR EYES) 0.012 % ophthalmic solution Place 1 drop into both eyes daily. As needed for itching     Historical Provider, MD  PredniSONE 10 MG KIT 12 day dose pack po 03/09/13   Gregor Hams, MD  Prenatal Vitamins (DIS) TABS Take 1 tablet by mouth daily.       Historical Provider, MD   BP 124/85 mmHg  Pulse 71  Temp(Src) 98.2 F (36.8 C) (Oral)  Resp 18  SpO2 100%  LMP 08/04/2014 Physical Exam  ED Course  Procedures (including critical care time) Labs Review Labs Reviewed - No data to display  Imaging Review No results found.   MDM   1. Acute suppurative otitis media of right ear without spontaneous rupture of tympanic membrane, recurrence not specified    Pt seen and examined and plan of care discussed with resident.    Billy Fischer, MD 09/04/14 [phone]

## 2014-09-21 ENCOUNTER — Ambulatory Visit (INDEPENDENT_AMBULATORY_CARE_PROVIDER_SITE_OTHER): Payer: Self-pay | Admitting: Otolaryngology

## 2014-09-21 DIAGNOSIS — J039 Acute tonsillitis, unspecified: Secondary | ICD-10-CM

## 2014-09-21 DIAGNOSIS — H6983 Other specified disorders of Eustachian tube, bilateral: Secondary | ICD-10-CM

## 2014-10-17 ENCOUNTER — Ambulatory Visit (INDEPENDENT_AMBULATORY_CARE_PROVIDER_SITE_OTHER): Payer: 59 | Admitting: Family Medicine

## 2014-10-17 ENCOUNTER — Encounter: Payer: Self-pay | Admitting: Family Medicine

## 2014-10-17 VITALS — BP 130/62 | HR 81 | Temp 98.5°F | Resp 16 | Ht 65.0 in | Wt 248.4 lb

## 2014-10-17 DIAGNOSIS — Z309 Encounter for contraceptive management, unspecified: Secondary | ICD-10-CM | POA: Diagnosis not present

## 2014-10-17 DIAGNOSIS — R5383 Other fatigue: Secondary | ICD-10-CM

## 2014-10-17 DIAGNOSIS — N926 Irregular menstruation, unspecified: Secondary | ICD-10-CM

## 2014-10-17 LAB — CBC WITH DIFFERENTIAL/PLATELET
BASOS ABS: 0 10*3/uL (ref 0.0–0.1)
BASOS PCT: 0 % (ref 0–1)
Eosinophils Absolute: 0 10*3/uL (ref 0.0–0.7)
Eosinophils Relative: 0 % (ref 0–5)
HEMATOCRIT: 40 % (ref 36.0–46.0)
Hemoglobin: 13.8 g/dL (ref 12.0–15.0)
LYMPHS PCT: 21 % (ref 12–46)
Lymphs Abs: 2.7 10*3/uL (ref 0.7–4.0)
MCH: 29.4 pg (ref 26.0–34.0)
MCHC: 34.5 g/dL (ref 30.0–36.0)
MCV: 85.3 fL (ref 78.0–100.0)
MPV: 8.3 fL — ABNORMAL LOW (ref 8.6–12.4)
Monocytes Absolute: 0.8 10*3/uL (ref 0.1–1.0)
Monocytes Relative: 6 % (ref 3–12)
NEUTROS PCT: 73 % (ref 43–77)
Neutro Abs: 9.5 10*3/uL — ABNORMAL HIGH (ref 1.7–7.7)
Platelets: 343 10*3/uL (ref 150–400)
RBC: 4.69 MIL/uL (ref 3.87–5.11)
RDW: 13.3 % (ref 11.5–15.5)
WBC: 13 10*3/uL — ABNORMAL HIGH (ref 4.0–10.5)

## 2014-10-17 LAB — COMPREHENSIVE METABOLIC PANEL
ALT: 24 U/L (ref 0–35)
AST: 19 U/L (ref 0–37)
Albumin: 4.1 g/dL (ref 3.5–5.2)
Alkaline Phosphatase: 52 U/L (ref 39–117)
BILIRUBIN TOTAL: 0.3 mg/dL (ref 0.2–1.2)
BUN: 12 mg/dL (ref 6–23)
CO2: 26 meq/L (ref 19–32)
Calcium: 9.2 mg/dL (ref 8.4–10.5)
Chloride: 103 mEq/L (ref 96–112)
Creat: 0.65 mg/dL (ref 0.50–1.10)
Glucose, Bld: 73 mg/dL (ref 70–99)
Potassium: 4 mEq/L (ref 3.5–5.3)
SODIUM: 138 meq/L (ref 135–145)
TOTAL PROTEIN: 7 g/dL (ref 6.0–8.3)

## 2014-10-17 LAB — THYROID PANEL WITH TSH
FREE THYROXINE INDEX: 2.2 (ref 1.4–3.8)
T3 Uptake: 25 % (ref 22–35)
T4 TOTAL: 8.6 ug/dL (ref 4.5–12.0)
TSH: 1.094 u[IU]/mL (ref 0.350–4.500)

## 2014-10-17 NOTE — Patient Instructions (Signed)
[facility] Please allow 2 least 2 weeks for appointment details. If you have not heard anything by the end of the month, contact us for status of referral.

## 2014-10-18 LAB — VITAMIN D 25 HYDROXY (VIT D DEFICIENCY, FRACTURES): VIT D 25 HYDROXY: 23 ng/mL — AB (ref 30–100)

## 2014-10-19 ENCOUNTER — Encounter: Payer: Self-pay | Admitting: Family Medicine

## 2014-10-19 ENCOUNTER — Other Ambulatory Visit: Payer: Self-pay | Admitting: Family Medicine

## 2014-10-19 ENCOUNTER — Ambulatory Visit (INDEPENDENT_AMBULATORY_CARE_PROVIDER_SITE_OTHER): Payer: 59 | Admitting: Otolaryngology

## 2014-10-19 DIAGNOSIS — H6983 Other specified disorders of Eustachian tube, bilateral: Secondary | ICD-10-CM | POA: Diagnosis not present

## 2014-10-19 DIAGNOSIS — J039 Acute tonsillitis, unspecified: Secondary | ICD-10-CM

## 2014-10-19 MED ORDER — ERGOCALCIFEROL 1.25 MG (50000 UT) PO CAPS: 50000.0000 [IU] | ORAL_CAPSULE | ORAL | Status: AC

## 2014-10-19 NOTE — Progress Notes (Signed)
Subjective:    Patient ID: Caitlyn Galvan, female    DOB: [birth year], 24 y.o.   MRN: [id number]  HPI  This 24 y.o. Female is new to [facility] to establish care and discuss having IUD removed. This device was placed by GYN post delivery of her son, now 23 years old. Pt is not  Pleased w/ IUD; she has irreg menses and mild pelvic pain. Pt would like to be referred back to GYN for IUD removal w/ discussion of oral contraception. She does not desire pregnancy at this time.  Pt c/o fatigue for several months. Sleep hygiene is fair; she is juggling a lot of responsibilities. Pt is overweight and nutrition is fair. She does not have time for exercise. She does not have fever/chills, abnormal weight change, CP, palpitations, cough or SOB, GI problems, HA, dizziness, weakness or syncope.  Patient Active Problem List   Diagnosis Date Noted  . Irregular menses 10/19/2014  . Severe obesity (BMI >= 40) 10/19/2014    Past Surgical History  Procedure Laterality Date  . Cesarean section    . Wisdom tooth extraction      History   Social History  . Marital Status: Single    Spouse Name: N/A  . Number of Children: N/A  . Years of Education: N/A   Occupational History  . Not on file.   Social History Main Topics  . Smoking status: Current Every Day Smoker    Types: Cigarettes  . Smokeless tobacco: Not on file     Comment: 1/2 pack a week  . Alcohol Use: No  . Drug Use: No     Comment: in the past  . Sexual Activity: Yes    Birth Control/ Protection: None   Other Topics Concern  . Not on file   Social History Narrative    History reviewed. No pertinent family history.   Review of Systems  Constitutional: Positive for fatigue.  HENT: Negative.   Eyes: Negative.   Respiratory: Negative.   Cardiovascular: Negative.   Gastrointestinal: Negative.   Endocrine: Negative.   Genitourinary: Negative.   Musculoskeletal: Negative.   Skin: Negative.   Allergic/Immunologic: Negative.    Neurological: Negative.   Hematological: Negative.   Psychiatric/Behavioral: Negative.       Objective:   Physical Exam  Constitutional: She is oriented to person, place, and time. She appears well-developed and well-nourished. No distress.  Blood pressure 130/62, pulse 81, temperature 98.5 F (36.9 C), temperature source Oral, resp. rate 16, height  (1.651 m), weight 248 lb 6.4 oz (112.674 kg), last menstrual period 09/21/2014, SpO2 99 %.    HENT:  Head: Normocephalic and atraumatic.  Right Ear: External ear normal.  Left Ear: External ear normal.  Nose: Nose normal.  Mouth/Throat: Oropharynx is clear and moist.  Eyes: Conjunctivae and EOM are normal. No scleral icterus.  Neck: Normal range of motion. Neck supple. No thyromegaly present.  Cardiovascular: Normal rate, regular rhythm, normal heart sounds and intact distal pulses.  Exam reveals no gallop.   No murmur heard. Pulmonary/Chest: Effort normal and breath sounds normal. No respiratory distress.  Musculoskeletal: Normal range of motion. She exhibits no edema or tenderness.  Lymphadenopathy:    She has no cervical adenopathy.  Neurological: She is alert and oriented to person, place, and time. She has normal strength. She is not disoriented. She displays no atrophy. No cranial nerve deficit or sensory deficit. She exhibits normal muscle tone. Coordination and gait normal.  Reflex Scores:  Tricep reflexes are 1+ on the right side and 1+ on the left side.      Bicep reflexes are 1+ on the right side and 1+ on the left side.      Brachioradialis reflexes are 1+ on the right side and 1+ on the left side.      Patellar reflexes are 2+ on the right side and 2+ on the left side. Skin: Skin is warm, dry and intact. No ecchymosis, no lesion and no rash noted. She is not diaphoretic. No cyanosis or erythema. No pallor. Nails show no clubbing.  Psychiatric: She has a normal mood and affect. Her speech is normal and behavior is  normal. Judgment and thought content normal. Cognition and memory are normal.  Nursing note and vitals reviewed.     Assessment & Plan:  Irregular menses - Plan: CBC with Differential/Platelet, Ambulatory referral to Gynecology  Encounter for contraceptive management, unspecified encounter - Plan: Ambulatory referral to Gynecology  Other fatigue - Plan: Comprehensive metabolic panel, Thyroid Panel With TSH, Vitamin D, 25-hydroxy

## 2014-11-20 ENCOUNTER — Encounter: Payer: Self-pay | Admitting: Obstetrics & Gynecology

## 2014-11-20 ENCOUNTER — Ambulatory Visit (INDEPENDENT_AMBULATORY_CARE_PROVIDER_SITE_OTHER): Payer: 59 | Admitting: Obstetrics & Gynecology

## 2014-11-20 VITALS — BP 126/86 | HR 81 | Ht 63.0 in | Wt 251.4 lb

## 2014-11-20 DIAGNOSIS — Z30432 Encounter for removal of intrauterine contraceptive device: Secondary | ICD-10-CM | POA: Diagnosis not present

## 2014-11-20 DIAGNOSIS — Z30011 Encounter for initial prescription of contraceptive pills: Secondary | ICD-10-CM

## 2014-11-20 MED ORDER — NORGESTIMATE-ETH ESTRADIOL 0.25-35 MG-MCG PO TABS: 1.0000 | ORAL_TABLET | Freq: Every day | ORAL | Status: DC

## 2014-11-20 NOTE — Progress Notes (Signed)
    GYNECOLOGY CLINIC PROCEDURE NOTE  Caitlyn DickerMarissa R Galvan is a 24 y.o. G1P1001 here for Mirena IUD removal. No GYN concerns.  Never had a pap smear.    IUD Removal  Patient identified, informed consent performed, consent signed.  Patient was in the dorsal lithotomy position, normal external genitalia was noted.  A speculum was placed in the patient's vagina, normal discharge was noted, no lesions. The cervix was visualized, no lesions, no abnormal discharge.  The strings of the IUD were grasped and pulled using ring forceps. The IUD was removed in its entirety.  Patient tolerated the procedure well.    Patient will use Sprintec for contraception; prescribed for one year.    Routine preventative health maintenance measures emphasized, patient will return for annual exam and pap smear in about two months, will also have OCP/BP check at that visit.   [name]  Savon Bordonaro, MD, FACOG Attending Obstetrician & Gynecologist Center for [facility]

## 2014-11-20 NOTE — Patient Instructions (Signed)
Return to clinic for any scheduled appointments or for any gynecologic concerns as needed.   

## 2015-01-23 ENCOUNTER — Ambulatory Visit: Payer: 59 | Admitting: Obstetrics & Gynecology

## 2015-04-18 ENCOUNTER — Other Ambulatory Visit: Payer: Self-pay

## 2015-04-18 NOTE — Telephone Encounter (Signed)
Pharm faxed req for RFs of vit D 50,000 IU. I declined w/note that pt is supposed to change to OTC 2000 units QD after her RFs ran out.

## 2015-05-10 ENCOUNTER — Ambulatory Visit (INDEPENDENT_AMBULATORY_CARE_PROVIDER_SITE_OTHER): Payer: 59 | Admitting: Family Medicine

## 2015-05-10 VITALS — BP 118/80 | HR 91 | Temp 98.5°F | Resp 16 | Ht 63.0 in | Wt 252.0 lb

## 2015-05-10 DIAGNOSIS — R350 Frequency of micturition: Secondary | ICD-10-CM | POA: Diagnosis not present

## 2015-05-10 DIAGNOSIS — A499 Bacterial infection, unspecified: Secondary | ICD-10-CM | POA: Diagnosis not present

## 2015-05-10 DIAGNOSIS — N390 Urinary tract infection, site not specified: Secondary | ICD-10-CM

## 2015-05-10 DIAGNOSIS — Z113 Encounter for screening for infections with a predominantly sexual mode of transmission: Secondary | ICD-10-CM

## 2015-05-10 LAB — POCT URINALYSIS DIP (MANUAL ENTRY)
Bilirubin, UA: NEGATIVE
Glucose, UA: NEGATIVE
Ketones, POC UA: NEGATIVE
Nitrite, UA: NEGATIVE
Spec Grav, UA: 1.025
Urobilinogen, UA: 0.2
pH, UA: 5.5

## 2015-05-10 LAB — POC MICROSCOPIC URINALYSIS (UMFC)

## 2015-05-10 MED ORDER — CEPHALEXIN 500 MG PO CAPS: 500.0000 mg | ORAL_CAPSULE | Freq: Two times a day (BID) | ORAL | Status: DC

## 2015-05-10 NOTE — Progress Notes (Signed)
Chief Complaint:  Chief Complaint  Patient presents with  . Urinary Frequency    x 1 week  . burning urination    x 1 week  . Hematuria    x 1 day    HPI: Caitlyn Galvan is a 24 y.o. female who reports to [facility] today complaining of 1 week history of UTI sxs burning, increase frequency, hameturia which has resolved, no fevers, chils, nausea, vomiting, back pain.  She would also like STD testing. No vaginal dc. No itching.   Past Medical History  Diagnosis Date  . No pertinent past medical history    Past Surgical History  Procedure Laterality Date  . Cesarean section    . Wisdom tooth extraction     Social History   Social History  . Marital Status: Single    Spouse Name: N/A  . Number of Children: N/A  . Years of Education: N/A   Social History Main Topics  . Smoking status: Former Smoker    Types: Cigarettes  . Smokeless tobacco: Never Used     Comment: 1/2 pack a week  . Alcohol Use: 0.0 oz/week    0 Standard drinks or equivalent per week     Comment: occasionally  . Drug Use: No     Comment: in the past  . Sexual Activity:    Partners: Male    Pharmacist, hospital Protection: IUD     Comment: Mirena   Other Topics Concern  . None   Social History Narrative   History reviewed. No pertinent family history. No Known Allergies Prior to Admission medications   Medication Sig Start Date End Date Taking? Authorizing Provider  ergocalciferol (DRISDOL) 50000 UNITS capsule Take 1 capsule (50,000 Units total) by mouth once a week. 10/19/14 10/19/15 Yes Maurice March, MD  norgestimate-ethinyl estradiol (ORTHO-CYCLEN,SPRINTEC,PREVIFEM) 0.25-35 MG-MCG tablet Take 1 tablet by mouth daily. 11/20/14  Yes Tereso Newcomer, MD     ROS: The patient denies fevers, chills, night sweats, unintentional weight loss, chest pain, palpitations, wheezing, dyspnea on exertion, nausea, vomiting, abdominal pain, dysuria,  melena, numbness, weakness, or tingling.  All  other systems have been reviewed and were otherwise negative with the exception of those mentioned in the HPI and as above.    PHYSICAL EXAM: Filed Vitals:   05/10/15 1057  BP: 118/80  Pulse: 91  Temp: 98.5 F (36.9 C)  Resp: 16   Body mass index is 44.65 kg/(m^2).   General: Alert, no acute distress HEENT:  Normocephalic, atraumatic, oropharynx patent. EOMI, PERRLA Cardiovascular:  Regular rate and rhythm, no rubs murmurs or gallops.  No Carotid bruits, radial pulse intact. No pedal edema.  Respiratory: Clear to auscultation bilaterally.  No wheezes, rales, or rhonchi.  No cyanosis, no use of accessory musculature Abdominal: No organomegaly, abdomen is soft and non-tender, positive bowel sounds. No masses. Skin: No rashes. Neurologic: Facial musculature symmetric. Psychiatric: Patient acts appropriately throughout our interaction. Lymphatic: No cervical or submandibular lymphadenopathy Musculoskeletal: Gait intact. No edema, tenderness   LABS: Results for orders placed or performed in visit on 05/10/15  GC/Chlamydia Probe Amp  Result Value Ref Range   CT Probe RNA NEGATIVE    GC Probe RNA NEGATIVE   Urine culture  Result Value Ref Range   Colony Count 30,000 COLONIES/ML    Preliminary Report Gram Negative Rods   POCT urinalysis dipstick  Result Value Ref Range   Color, UA yellow yellow   Clarity, UA cloudy (A)  clear   Glucose, UA negative negative   Bilirubin, UA negative negative   Ketones, POC UA negative negative   Spec Grav, UA 1.025    Blood, UA large (A) negative   pH, UA 5.5    Protein Ur, POC trace (A) negative   Urobilinogen, UA 0.2    Nitrite, UA Negative Negative   Leukocytes, UA moderate (2+) (A) Negative  POCT Microscopic Urinalysis ([facility])  Result Value Ref Range   WBC,UR,HPF,POC Many (A) None WBC/hpf   RBC,UR,HPF,POC Moderate (A) None RBC/hpf   Bacteria None None   Mucus Present (A) Absent   Epithelial Cells, UR Per Microscopy Few (A) None  cells/hpf     EKG/XRAY:   Primary read interpreted by Dr. Conley Rolls at [facility].   ASSESSMENT/PLAN: Encounter Diagnoses  Name Primary?  . Increased frequency of urination Yes  . Screening for STD (sexually transmitted disease)   . UTI (urinary tract infection), bacterial    Rx Keflex and labs pending  Gross sideeffects, risk and benefits, and alternatives of medications d/w patient. Patient is aware that all medications have potential sideeffects and we are unable to predict every sideeffect or drug-drug interaction that may occur.  Pranay Hilbun DO  05/12/2015 1:10 PM

## 2015-05-10 NOTE — Patient Instructions (Signed)
Urinary Tract Infection Urinary tract infections (UTIs) can develop anywhere along your urinary tract. Your urinary tract is your body's drainage system for removing wastes and extra water. Your urinary tract includes two kidneys, two ureters, a bladder, and a urethra. Your kidneys are a pair of bean-shaped organs. Each kidney is about the size of your fist. They are located below your ribs, one on each side of your spine. CAUSES Infections are caused by microbes, which are microscopic organisms, including fungi, viruses, and bacteria. These organisms are so small that they can only be seen through a microscope. Bacteria are the microbes that most commonly cause UTIs. SYMPTOMS  Symptoms of UTIs may vary by age and gender of the patient and by the location of the infection. Symptoms in young women typically include a frequent and intense urge to urinate and a painful, burning feeling in the bladder or urethra during urination. Older women and men are more likely to be tired, shaky, and weak and have muscle aches and abdominal pain. A fever may mean the infection is in your kidneys. Other symptoms of a kidney infection include pain in your back or sides below the ribs, nausea, and vomiting. DIAGNOSIS To diagnose a UTI, your caregiver will ask you about your symptoms. Your caregiver will also ask you to provide a urine sample. The urine sample will be tested for bacteria and white blood cells. White blood cells are made by your body to help fight infection. TREATMENT  Typically, UTIs can be treated with medication. Because most UTIs are caused by a bacterial infection, they usually can be treated with the use of antibiotics. The choice of antibiotic and length of treatment depend on your symptoms and the type of bacteria causing your infection. HOME CARE INSTRUCTIONS  If you were prescribed antibiotics, take them exactly as your caregiver instructs you. Finish the medication even if you feel better after  you have only taken some of the medication.  Drink enough water and fluids to keep your urine clear or pale yellow.  Avoid caffeine, tea, and carbonated beverages. They tend to irritate your bladder.  Empty your bladder often. Avoid holding urine for long periods of time.  Empty your bladder before and after sexual intercourse.  After a bowel movement, women should cleanse from front to back. Use each tissue only once. SEEK MEDICAL CARE IF:   You have back pain.  You develop a fever.  Your symptoms do not begin to resolve within 3 days. SEEK IMMEDIATE MEDICAL CARE IF:   You have severe back pain or lower abdominal pain.  You develop chills.  You have nausea or vomiting.  You have continued burning or discomfort with urination. MAKE SURE YOU:   Understand these instructions.  Will watch your condition.  Will get help right away if you are not doing well or get worse.   This information is not intended to replace advice given to you by your health care provider. Make sure you discuss any questions you have with your health care provider.   Document Released: 04/30/2005 Document Revised: 04/11/2015 Document Reviewed: 08/29/2011 Elsevier Interactive Patient Education 2016 Elsevier Inc.  

## 2015-05-11 ENCOUNTER — Telehealth: Payer: Self-pay | Admitting: Family Medicine

## 2015-05-11 LAB — GC/CHLAMYDIA PROBE AMP
CT Probe RNA: NEGATIVE
GC Probe RNA: NEGATIVE

## 2015-05-11 NOTE — Telephone Encounter (Signed)
Spoke with patient about neg GC

## 2015-05-12 LAB — URINE CULTURE: Colony Count: 30000

## 2015-07-14 ENCOUNTER — Encounter (HOSPITAL_COMMUNITY): Payer: Self-pay

## 2015-07-14 ENCOUNTER — Inpatient Hospital Stay (HOSPITAL_COMMUNITY): Payer: 59

## 2015-07-14 ENCOUNTER — Inpatient Hospital Stay (HOSPITAL_COMMUNITY)
Admission: AD | Admit: 2015-07-14 | Discharge: 2015-07-15 | Disposition: A | Discharge status: Home / Self Care | Payer: 59 | Source: Ambulatory Visit | Attending: Obstetrics and Gynecology | Admitting: Obstetrics and Gynecology

## 2015-07-14 DIAGNOSIS — Z87891 Personal history of nicotine dependence: Secondary | ICD-10-CM | POA: Diagnosis not present

## 2015-07-14 DIAGNOSIS — O200 Threatened abortion: Secondary | ICD-10-CM | POA: Insufficient documentation

## 2015-07-14 DIAGNOSIS — O209 Hemorrhage in early pregnancy, unspecified: Secondary | ICD-10-CM

## 2015-07-14 DIAGNOSIS — Z3A09 9 weeks gestation of pregnancy: Secondary | ICD-10-CM | POA: Insufficient documentation

## 2015-07-14 LAB — URINALYSIS, ROUTINE W REFLEX MICROSCOPIC
BILIRUBIN URINE: NEGATIVE
GLUCOSE, UA: NEGATIVE mg/dL
KETONES UR: NEGATIVE mg/dL
Leukocytes, UA: NEGATIVE
Nitrite: NEGATIVE
PH: 5.5 (ref 5.0–8.0)
Protein, ur: NEGATIVE mg/dL
Specific Gravity, Urine: 1.02 (ref 1.005–1.030)

## 2015-07-14 LAB — HCG, QUANTITATIVE, PREGNANCY: HCG, BETA CHAIN, QUANT, S: 9894 m[IU]/mL — AB (ref <5)

## 2015-07-14 LAB — URINE MICROSCOPIC-ADD ON

## 2015-07-14 NOTE — MAU Note (Signed)
Spotting for 2 wks. Last night and tonight saw blood in toilet and ? Tissue.  Mild abd cramps

## 2015-07-14 NOTE — MAU Provider Note (Signed)
History    CSN: [id number] Arrival date and time: 07/14/15 2116 First Provider Initiated Contact with Patient 07/14/15 2217     Chief Complaint  Patient presents with  . Vaginal Bleeding   HPI Patient is 24 y.o. G2P1001 at [redacted]w[redacted]d (by unsure LMP) reports she has had brown spotting since last Monday. She reports today she had no spotting all day and then noticed pink colored discharge. She denies bright red bleeding. She had an [place] at her OB office which confirmed IUP (I suspect gestational sac only by pt description) but no fetal pole/FHR was seen- she reports she was not given an EDD but the US showed she was only 5-6 weeks by the [place] but ~8 weeks by her unsure LMP.  The plan was to return on 12/13 for a repeat US. She report no cramping. Mild nausea and breast tenderness. She is worried about a miscarriage.   OB History    Gravida Para Term Preterm AB TAB SAB Ectopic Multiple Living   0 0 0 0 0 0 1      Past Medical History  Diagnosis Date  . No pertinent past medical history     Past Surgical History  Procedure Laterality Date  . Cesarean section    . Wisdom tooth extraction    . Tubes in ears    . Adnoids      No family history on file.  Social History  Substance Use Topics  . Smoking status: Former Smoker    Types: Cigarettes  . Smokeless tobacco: Never Used     Comment: 1/2 pack a week  . Alcohol Use: 0.0 oz/week    0 Standard drinks or equivalent per week     Comment: occasionally    Allergies: No Known Allergies  Prescriptions prior to admission  Medication Sig Dispense Refill Last Dose  . acetaminophen (TYLENOL) 500 MG tablet Take 500 mg by mouth every 6 (six) hours as needed for mild pain.   Past Week at Unknown time  . Prenatal Vit-Fe Fumarate-FA (PRENATAL MULTIVITAMIN) TABS tablet Take 1 tablet by mouth daily at 12 noon.   07/14/2015 at Unknown time  . cephALEXin (KEFLEX) 500 MG capsule Take 1 capsule (500 mg total) by mouth 2 (two) times daily.  (Patient not taking: Reported on 07/14/2015) 14 capsule 0 Completed Course at Unknown time  . ergocalciferol (DRISDOL) 50000 UNITS capsule Take 1 capsule (50,000 Units total) by mouth once a week. (Patient not taking: Reported on 07/14/2015) 4 capsule 5 Completed Course at Unknown time  . norgestimate-ethinyl estradiol (ORTHO-CYCLEN,SPRINTEC,PREVIFEM) 0.25-35 MG-MCG tablet Take 1 tablet by mouth daily. (Patient not taking: Reported on 07/14/2015) 1 Package 11 Not Taking at Unknown time    Review of Systems  Constitutional: Negative for fever and chills.  Eyes: Negative for blurred vision and double vision.  Respiratory: Negative for cough and shortness of breath.   Cardiovascular: Negative for chest pain and orthopnea.  Gastrointestinal: Negative for nausea and vomiting.  Genitourinary: Negative for dysuria, frequency and flank pain.  Musculoskeletal: Negative for myalgias.  Skin: Negative for rash.  Neurological: Negative for dizziness, tingling, weakness and headaches.  Endo/Heme/Allergies: Does not bruise/bleed easily.  Psychiatric/Behavioral: Negative for depression and suicidal ideas. The patient is not nervous/anxious.    Physical Exam   Blood pressure 135/78, pulse 91, temperature 98.2 F (36.8 C), resp. rate 18, height  (1.6 m), weight 248 lb (112.492 kg), last menstrual period 05/09/2015.  Physical Exam  Nursing note  and vitals reviewed. Constitutional: She is oriented to person, place, and time. She appears well-developed and well-nourished. No distress.  HENT:  Head: Normocephalic and atraumatic.  Eyes: Conjunctivae are normal. No scleral icterus.  Neck: Normal range of motion. Neck supple.  Cardiovascular: Normal rate and intact distal pulses.   Respiratory: Effort normal. She exhibits no tenderness.  GI: Soft. There is no tenderness. There is no rebound and no guarding.  Obese  Genitourinary: Cervix exhibits no motion tenderness, no discharge and no friability.  There is bleeding in the vagina.  Brown blood in vaginal vault. Moderate amount. No blood from cervical os. External os appears closed. Blood was cleared away with fox swab and did not re-accumulate. Manual exam revealed a closed cervical os.  Musculoskeletal: Normal range of motion. She exhibits no edema.  Neurological: She is alert and oriented to person, place, and time.  Skin: Skin is warm and dry. No rash noted.  Psychiatric: She has a normal mood and affect.    MAU Course  Procedures  MDM UPT- POSITIVE  Lab Results  Component Value Date   HCGBETAQNT [id number]* 07/14/2015   ABO type (07/22/2010):  A Positive   US 12/10 IMPRESSION: Intrauterine pregnancy with fetal pole, estimated gestational age based on crown-rump length of 6 weeks 1 day, however no fetal cardiac activity demonstrated. A yolk sac is questionably identified and possibly collapsed. Recommend trending of beta HCG, and sonographic follow-up as indicated clinically.   Assessment and Plan  Differential includes normal variant, threatened AB. Not ectopic given known IUP on Monday . Patient has had  an US that confirmed IUP but no cardiac activity was seen.   No need for rhogam given A POS Discharge to home Reviewed SAB precautions reviewed Will return in 2 days for repeat BHCG or US at office.   Isa RankinKimberly Niles [facility] 07/15/2015, 12:06 AM

## 2015-07-15 DIAGNOSIS — O200 Threatened abortion: Secondary | ICD-10-CM

## 2015-07-15 NOTE — Discharge Instructions (Signed)
Return to care   If you have heavier bleeding that soaks through more that 2 pads per hour for an hour of more  If you bleed so much that you feel like your might pass out  If you have significant abdominal pain that is not improved with Tylenol   If you develop a fever > 100.5  Threatened Miscarriage A threatened miscarriage is when you have vaginal bleeding during your first 20 weeks of pregnancy but the pregnancy has not ended. Your doctor will do tests to make sure you are still pregnant. The cause of the bleeding may not be known. This condition does not mean your pregnancy will end. It does increase the risk of it ending (complete miscarriage). HOME CARE   Make sure you keep all your doctor visits for prenatal care.  Get plenty of rest.  Do not have sex or use tampons if you have vaginal bleeding.  Do not douche.  Do not smoke or use drugs.  Do not drink alcohol.  Avoid caffeine. GET HELP IF:  You have light bleeding from your vagina.  You have belly pain or cramping.  You have a fever. GET HELP RIGHT AWAY IF:   You have heavy bleeding from your vagina.  You have clots of blood coming from your vagina.  You have bad pain or cramps in your low back or belly.  You have fever, chills, and bad belly pain. MAKE SURE YOU:   Understand these instructions.  Will watch your condition.  Will get help right away if you are not doing well or get worse.   This information is not intended to replace advice given to you by your health care provider. Make sure you discuss any questions you have with your health care provider.   Document Released: 07/03/2008 Document Revised: 07/26/2013 Document Reviewed: 05/17/2013 Elsevier Interactive Patient Education [employer]2016 Elsevier Inc.

## 2015-07-16 LAB — POCT PREGNANCY, URINE: Preg Test, Ur: POSITIVE — AB

## 2015-07-17 ENCOUNTER — Encounter (HOSPITAL_COMMUNITY): Payer: Self-pay

## 2015-07-18 ENCOUNTER — Encounter (HOSPITAL_COMMUNITY): Payer: Self-pay | Admitting: Anesthesiology

## 2015-07-18 MED ORDER — DEXTROSE 5 % IV SOLN
100.0000 mg | INTRAVENOUS | Status: AC
  Administered 2015-07-19: 100 mg via INTRAVENOUS
  Filled 2015-07-18 (×2): qty 100

## 2015-07-18 NOTE — H&P (Signed)
Caitlyn DickerMarissa R Galvan is an [age] y.o. female with missed ab measuring 6-7 weeks. After extensive counseling, the patient would like to proceed with D&E.  Blood type A pos.    Pertinent Gynecological History: Menses: n/a Bleeding: n/a Contraception: pregnant DES exposure: unknown Blood transfusions: none Sexually transmitted diseases: no past history Previous GYN Procedures: C/S  Last mammogram: n/a Date: n/a Last pap: unk Date: unk OB History: G2, P1   Menstrual History: Menarche age: n/a Patient's last menstrual period was 05/09/2015.    Past Medical History  Diagnosis Date  . No pertinent past medical history   . Anxiety   . Depression   . Headache     Past Surgical History  Procedure Laterality Date  . Cesarean section    . Wisdom tooth extraction    . Tubes in ears    . Adnoids    . Adenoidectomy      History reviewed. No pertinent family history.  Social History:  reports that she quit smoking about 13 months ago. Her smoking use included Cigarettes and E-cigarettes. She has a 1 pack-year smoking history. She has never used smokeless tobacco. She reports that she drinks alcohol. She reports that she does not use illicit drugs.  Allergies: No Known Allergies  No prescriptions prior to admission    ROS  Last menstrual period 05/09/2015. Physical Exam  Constitutional: She is oriented to person, place, and time. She appears well-developed and well-nourished.  Cardiovascular: Normal rate and regular rhythm.   Respiratory: Effort normal and breath sounds normal.  GI: Soft. Bowel sounds are normal. There is no rebound and no guarding.  Neurological: She is alert and oriented to person, place, and time.  Skin: Skin is warm and dry.  Psychiatric: She has a normal mood and affect. Her behavior is normal.    No results found for this or any previous visit (from the past 24 hour(s)).  No results found.  Assessment/Plan: 24yo G2P1 with missed ab at 6-7  weeks -Patient is counseled re: risk of bleeding, infection, scarring and damage to surrounding structures.  All questions were answered and the patient wishes to proceed. -Suction D&C   Davan Hark 07/18/2015, 8:57 PM

## 2015-07-18 NOTE — Anesthesia Preprocedure Evaluation (Addendum)
Anesthesia Evaluation  Patient identified by MRN, date of birth, ID band Patient awake    Reviewed: Allergy & Precautions, NPO status , Patient's Chart, lab work & pertinent test results  Airway Mallampati: I  TM Distance: >3 FB Neck ROM: Full    Dental no notable dental hx. (+) Teeth Intact   Pulmonary former smoker,    Pulmonary exam normal breath sounds clear to auscultation       Cardiovascular negative cardio ROS Normal cardiovascular exam Rhythm:Regular Rate:Normal     Neuro/Psych  Headaches, PSYCHIATRIC DISORDERS Anxiety Depression    GI/Hepatic negative GI ROS, Neg liver ROS,   Endo/Other  Morbid obesity  Renal/GU negative Renal ROS  negative genitourinary   Musculoskeletal negative musculoskeletal ROS (+)   Abdominal (+) + obese,   Peds  Hematology negative hematology ROS (+)   Anesthesia Other Findings   Reproductive/Obstetrics Missed Ab                            Anesthesia Physical Anesthesia Plan  ASA: III  Anesthesia Plan: MAC   Post-op Pain Management:    Induction: Intravenous  Airway Management Planned: Natural Airway, Mask and Nasal Cannula  Additional Equipment:   Intra-op Plan:   Post-operative Plan:   Informed Consent: I have reviewed the patients History and Physical, chart, labs and discussed the procedure including the risks, benefits and alternatives for the proposed anesthesia with the patient or authorized representative who has indicated his/her understanding and acceptance.     Plan Discussed with: Anesthesiologist, CRNA and Surgeon  Anesthesia Plan Comments:         Anesthesia Quick Evaluation

## 2015-07-19 ENCOUNTER — Ambulatory Visit (HOSPITAL_COMMUNITY): Payer: 59 | Admitting: Anesthesiology

## 2015-07-19 ENCOUNTER — Ambulatory Visit (HOSPITAL_COMMUNITY)
Admission: RE | Admit: 2015-07-19 | Discharge: 2015-07-19 | Disposition: A | Discharge status: Home / Self Care | Payer: 59 | Source: Ambulatory Visit | Attending: Obstetrics & Gynecology | Admitting: Obstetrics & Gynecology

## 2015-07-19 ENCOUNTER — Encounter (HOSPITAL_COMMUNITY): Payer: Self-pay | Admitting: *Deleted

## 2015-07-19 ENCOUNTER — Encounter (HOSPITAL_COMMUNITY)
Admission: RE | Disposition: A | Discharge status: Home / Self Care | Payer: Self-pay | Source: Ambulatory Visit | Attending: Obstetrics & Gynecology

## 2015-07-19 DIAGNOSIS — Z3A01 Less than 8 weeks gestation of pregnancy: Secondary | ICD-10-CM | POA: Insufficient documentation

## 2015-07-19 DIAGNOSIS — O021 Missed abortion: Secondary | ICD-10-CM | POA: Diagnosis present

## 2015-07-19 DIAGNOSIS — Z87891 Personal history of nicotine dependence: Secondary | ICD-10-CM | POA: Diagnosis not present

## 2015-07-19 DIAGNOSIS — Z6841 Body Mass Index (BMI) 40.0 and over, adult: Secondary | ICD-10-CM | POA: Diagnosis not present

## 2015-07-19 HISTORY — DX: Major depressive disorder, single episode, unspecified: F32.9

## 2015-07-19 HISTORY — PX: DILATION AND EVACUATION: SHX1459

## 2015-07-19 HISTORY — DX: Headache, unspecified: R51.9

## 2015-07-19 HISTORY — DX: Headache: R51

## 2015-07-19 HISTORY — DX: Depression, unspecified: F32.A

## 2015-07-19 HISTORY — DX: Anxiety disorder, unspecified: F41.9

## 2015-07-19 LAB — CBC
HEMATOCRIT: 38.3 % (ref 36.0–46.0)
HEMOGLOBIN: 13 g/dL (ref 12.0–15.0)
MCH: 28.9 pg (ref 26.0–34.0)
MCHC: 33.9 g/dL (ref 30.0–36.0)
MCV: 85.1 fL (ref 78.0–100.0)
Platelets: 273 10*3/uL (ref 150–400)
RBC: 4.5 MIL/uL (ref 3.87–5.11)
RDW: 13.2 % (ref 11.5–15.5)
WBC: 8 10*3/uL (ref 4.0–10.5)

## 2015-07-19 SURGERY — DILATION AND EVACUATION, UTERUS
Anesthesia: Monitor Anesthesia Care | Site: Vagina

## 2015-07-19 MED ORDER — ONDANSETRON HCL 4 MG/2ML IJ SOLN
INTRAMUSCULAR | Status: AC
  Filled 2015-07-19: qty 2

## 2015-07-19 MED ORDER — MIDAZOLAM HCL 2 MG/2ML IJ SOLN
INTRAMUSCULAR | Status: AC
  Filled 2015-07-19: qty 2

## 2015-07-19 MED ORDER — FENTANYL CITRATE (PF) 250 MCG/5ML IJ SOLN
INTRAMUSCULAR | Status: AC
  Filled 2015-07-19: qty 5

## 2015-07-19 MED ORDER — KETOROLAC TROMETHAMINE 30 MG/ML IJ SOLN
INTRAMUSCULAR | Status: DC | PRN
  Administered 2015-07-19: 30 mg via INTRAVENOUS

## 2015-07-19 MED ORDER — KETOROLAC TROMETHAMINE 30 MG/ML IJ SOLN
INTRAMUSCULAR | Status: AC
  Filled 2015-07-19: qty 1

## 2015-07-19 MED ORDER — ONDANSETRON HCL 4 MG/2ML IJ SOLN
INTRAMUSCULAR | Status: DC | PRN
  Administered 2015-07-19: 4 mg via INTRAVENOUS

## 2015-07-19 MED ORDER — FENTANYL CITRATE (PF) 100 MCG/2ML IJ SOLN: 25.0000 ug | INTRAMUSCULAR | Status: DC | PRN

## 2015-07-19 MED ORDER — LIDOCAINE HCL (CARDIAC) 20 MG/ML IV SOLN
INTRAVENOUS | Status: DC | PRN
  Administered 2015-07-19: 100 mg via INTRAVENOUS

## 2015-07-19 MED ORDER — LACTATED RINGERS IV SOLN: INTRAVENOUS | Status: DC

## 2015-07-19 MED ORDER — DEXAMETHASONE SODIUM PHOSPHATE 4 MG/ML IJ SOLN
INTRAMUSCULAR | Status: DC | PRN
  Administered 2015-07-19: 4 mg via INTRAVENOUS

## 2015-07-19 MED ORDER — OXYCODONE-ACETAMINOPHEN 5-325 MG PO TABS: 1.0000 | ORAL_TABLET | ORAL | Status: DC | PRN

## 2015-07-19 MED ORDER — MEPERIDINE HCL 25 MG/ML IJ SOLN: 6.2500 mg | INTRAMUSCULAR | Status: DC | PRN

## 2015-07-19 MED ORDER — HYDROCODONE-ACETAMINOPHEN 7.5-325 MG PO TABS: 1.0000 | ORAL_TABLET | Freq: Once | ORAL | Status: DC | PRN

## 2015-07-19 MED ORDER — PROPOFOL 10 MG/ML IV BOLUS
INTRAVENOUS | Status: AC
  Filled 2015-07-19: qty 20

## 2015-07-19 MED ORDER — FENTANYL CITRATE (PF) 100 MCG/2ML IJ SOLN
INTRAMUSCULAR | Status: DC | PRN
  Administered 2015-07-19 (×4): 50 ug via INTRAVENOUS

## 2015-07-19 MED ORDER — FENTANYL CITRATE (PF) 250 MCG/5ML IJ SOLN
INTRAMUSCULAR | Status: AC
Start: 2015-07-19 — End: 2015-07-19
  Filled 2015-07-19: qty 5

## 2015-07-19 MED ORDER — MIDAZOLAM HCL 5 MG/5ML IJ SOLN
INTRAMUSCULAR | Status: DC | PRN
  Administered 2015-07-19: 2 mg via INTRAVENOUS

## 2015-07-19 MED ORDER — DEXAMETHASONE SODIUM PHOSPHATE 4 MG/ML IJ SOLN
INTRAMUSCULAR | Status: AC
  Filled 2015-07-19: qty 1

## 2015-07-19 MED ORDER — LIDOCAINE HCL (CARDIAC) 20 MG/ML IV SOLN
INTRAVENOUS | Status: AC
Start: 2015-07-19 — End: 2015-07-19
  Filled 2015-07-19: qty 5

## 2015-07-19 MED ORDER — LACTATED RINGERS IV SOLN
INTRAVENOUS | Status: DC
  Administered 2015-07-19: 09:00:00 via INTRAVENOUS

## 2015-07-19 MED ORDER — PROMETHAZINE HCL 25 MG/ML IJ SOLN: 6.2500 mg | INTRAMUSCULAR | Status: DC | PRN

## 2015-07-19 MED ORDER — PROPOFOL 10 MG/ML IV BOLUS
INTRAVENOUS | Status: DC | PRN
  Administered 2015-07-19: 20 mg via INTRAVENOUS
  Administered 2015-07-19: 30 mg via INTRAVENOUS
  Administered 2015-07-19: 20 mg via INTRAVENOUS
  Administered 2015-07-19 (×2): 30 mg via INTRAVENOUS

## 2015-07-19 MED ORDER — DOXYCYCLINE HYCLATE 100 MG PO TABS
ORAL_TABLET | ORAL | Status: AC
  Administered 2015-07-19: 200 mg via ORAL
  Filled 2015-07-19: qty 2

## 2015-07-19 MED ORDER — SCOPOLAMINE 1 MG/3DAYS TD PT72: 1.0000 | MEDICATED_PATCH | Freq: Once | TRANSDERMAL | Status: DC

## 2015-07-19 MED ORDER — LIDOCAINE HCL (CARDIAC) 20 MG/ML IV SOLN
INTRAVENOUS | Status: AC
  Filled 2015-07-19: qty 5

## 2015-07-19 MED ORDER — CHLOROPROCAINE HCL 1 % IJ SOLN
INTRAMUSCULAR | Status: AC
  Filled 2015-07-19: qty 30

## 2015-07-19 MED ORDER — DOXYCYCLINE HYCLATE 100 MG PO TABS
200.0000 mg | ORAL_TABLET | Freq: Once | ORAL | Status: AC
  Administered 2015-07-19: 200 mg via ORAL

## 2015-07-19 MED ORDER — CHLOROPROCAINE HCL 1 % IJ SOLN
INTRAMUSCULAR | Status: DC | PRN
  Administered 2015-07-19: 10 mL

## 2015-07-19 MED ORDER — PROPOFOL 10 MG/ML IV BOLUS
INTRAVENOUS | Status: AC
  Filled 2015-07-19: qty 40

## 2015-07-19 MED ORDER — IBUPROFEN 600 MG PO TABS: 600.0000 mg | ORAL_TABLET | Freq: Four times a day (QID) | ORAL | Status: DC | PRN

## 2015-07-19 SURGICAL SUPPLY — 20 items
CATH ROBINSON RED A/P 16FR (CATHETERS) ×3 IMPLANT
CLOTH BEACON ORANGE TIMEOUT ST (SAFETY) ×3 IMPLANT
DECANTER SPIKE VIAL GLASS SM (MISCELLANEOUS) ×3 IMPLANT
GLOVE BIO SURGEON STRL SZ 6 (GLOVE) ×3 IMPLANT
GLOVE BIOGEL PI IND STRL 6 (GLOVE) ×2 IMPLANT
GLOVE BIOGEL PI IND STRL 7.0 (GLOVE) ×1 IMPLANT
GLOVE BIOGEL PI INDICATOR 6 (GLOVE) ×4
GLOVE BIOGEL PI INDICATOR 7.0 (GLOVE) ×2
GOWN STRL REUS W/TWL LRG LVL3 (GOWN DISPOSABLE) ×6 IMPLANT
KIT BERKELEY 1ST TRIMESTER 3/8 (MISCELLANEOUS) ×3 IMPLANT
NS IRRIG 1000ML POUR BTL (IV SOLUTION) ×3 IMPLANT
PACK VAGINAL MINOR WOMEN LF (CUSTOM PROCEDURE TRAY) ×3 IMPLANT
PAD OB MATERNITY 4.3X12.25 (PERSONAL CARE ITEMS) ×3 IMPLANT
PAD PREP 24X48 CUFFED NSTRL (MISCELLANEOUS) ×3 IMPLANT
SET BERKELEY SUCTION TUBING (SUCTIONS) ×3 IMPLANT
TOWEL OR 17X24 6PK STRL BLUE (TOWEL DISPOSABLE) ×6 IMPLANT
VACURETTE 10 RIGID CVD (CANNULA) IMPLANT
VACURETTE 7MM CVD STRL WRAP (CANNULA) IMPLANT
VACURETTE 8 RIGID CVD (CANNULA) IMPLANT
VACURETTE 9 RIGID CVD (CANNULA) IMPLANT

## 2015-07-19 NOTE — Progress Notes (Signed)
No change to H&P.  Racheal Mathurin, DO 

## 2015-07-19 NOTE — Transfer of Care (Signed)
Immediate Anesthesia Transfer of Care Note  Patient: Caitlyn Galvan  Procedure(s) Performed: Procedure(s): DILATATION AND EVACUATION (N/A)  Patient Location: PACU  Anesthesia Type:MAC  Level of Consciousness: awake, alert  and oriented  Airway & Oxygen Therapy: Patient Spontanous Breathing and Patient connected to nasal cannula oxygen  Post-op Assessment: Report given to RN  Post vital signs: Reviewed  Last Vitals:  Filed Vitals:   07/19/15 0814  BP: 149/89  Pulse: 83  Temp: 36.6 C  Resp: 20    Complications: No apparent anesthesia complications

## 2015-07-19 NOTE — Anesthesia Postprocedure Evaluation (Signed)
Anesthesia Post Note  Patient: Caitlyn Galvan  Procedure(s) Performed: Procedure(s) (LRB): DILATATION AND EVACUATION (N/A)  Patient location during evaluation: PACU Anesthesia Type: MAC Level of consciousness: awake and alert and oriented Pain management: pain level controlled Vital Signs Assessment: post-procedure vital signs reviewed and stable Respiratory status: spontaneous breathing, nonlabored ventilation and respiratory function stable Cardiovascular status: stable and blood pressure returned to baseline Postop Assessment: no signs of nausea or vomiting Anesthetic complications: no    Last Vitals:  Filed Vitals:   07/19/15 0814 07/19/15 0930  BP: 149/89 108/59  Pulse: 83 84  Temp: 36.6 C 37 C  Resp: 20 15    Last Pain:  Filed Vitals:   07/19/15 0934  PainSc: 3                  Moody Robben A.

## 2015-07-19 NOTE — Op Note (Signed)
PREOPERATIVE DIAGNOSIS: 24 y.o. with missed abortion measuring 7 weeks  POSTOPERATIVE DIAGNOSIS: The same  PROCEDURE: Suction Dilation and Curettage.  SURGEON: Dr. Mitchel HonourMegan Ailey Wessling  INDICATIONS: 24 y.o. G2P1001 here for scheduled surgery for treatment of missed abortion Risks of surgery were discussed with the patient including but not limited to: bleeding which may require transfusion; infection which may require antibiotics; injury to uterus or surrounding organs; intrauterine scarring which may impair future fertility; need for additional procedures including laparotomy or laparoscopy; and other postoperative/anesthesia complications. Written informed consent was obtained.   FINDINGS: An 8 week size uterus.   ANESTHESIA: Conscious sedation, paracervical block  ESTIMATED BLOOD LOSS: 25cc  SPECIMENS: POC sent to pathology  COMPLICATIONS: None immediate.  PROCEDURE DETAILS: The patient received intravenous antibiotics while in the preoperative area. She was then taken to the operating room where conscious sedation was administered and was found to be adequate. After an adequate timeout was performed, she was placed in the dorsal lithotomy position and examined; then prepped and draped in the sterile manner. Her bladder was catheterized for an unmeasured amount of clear, yellow urine. A speculum was then placed in the patient's vagina and a single tooth tenaculum was applied to the anterior lip of the cervix. A paracervical block using 10 ml of 1% nesacaine was administered. The uteus was dilated manually with metal dilators to accommodate the 7 mm suction curette. Once the cervix was dilated, the suction curette was advanced without difficulty. A total of three passes were taken with tissue returned. A sharp curette was used to feel for gritty texture circumferentially which was present. A final pass with the suction curette was performed to remove blood. The tenaculum was  removed from the anterior lip of the cervix and the vaginal speculum was removed after noting good hemostasis. The patient tolerated the procedure well and was taken to the recovery area awake, extubated and in stable condition.

## 2015-07-19 NOTE — Discharge Instructions (Signed)
Call MD for T>100.4, heavy vaginal bleeding, severe abdominal pain, or intractable nausea and/or vomiting.  Call office to schedule postop appointment in 2 weeks.  No driving while taking narcotics.  Pelvic rest x 4 weeks. DISCHARGE INSTRUCTIONS: D&C / D&E °The following instructions have been prepared to help you care for yourself upon your return home. °  °Personal hygiene: °• Use sanitary pads for vaginal drainage, not tampons. °• Shower the day after your procedure. °• NO tub baths, pools or Jacuzzis for 2-3 weeks. °• Wipe front to back after using the bathroom. ° °Activity and limitations: °• Do NOT drive or operate any equipment for 24 hours. The effects of anesthesia are still present and drowsiness may result. °• Do NOT rest in bed all day. °• Walking is encouraged. °• Walk up and down stairs slowly. °• You may resume your normal activity in one to two days or as indicated by your physician. ° °Sexual activity: NO intercourse for at least 2 weeks after the procedure, or as indicated by your physician. ° °Diet: Eat a light meal as desired this evening. You may resume your usual diet tomorrow. ° °Return to work: You may resume your work activities in one to two days or as indicated by your doctor. ° °What to expect after your surgery: Expect to have vaginal bleeding/discharge for 2-3 days and spotting for up to 10 days. It is not unusual to have soreness for up to 1-2 weeks. You may have a slight burning sensation when you urinate for the first day. Mild cramps may continue for a couple of days. You may have a regular period in 2-6 weeks. ° °Call your doctor for any of the following: °• Excessive vaginal bleeding, saturating and changing one pad every hour. °• Inability to urinate 6 hours after discharge from hospital. °• Pain not relieved by pain medication. °• Fever of 100.4° F or greater. °• Unusual vaginal discharge or odor. ° ° Call for an appointment:  ° ° °Patient’s signature:  ______________________ ° °Nurse’s signature ________________________ ° °Support person's signature_______________________ ° ° ° °

## 2015-07-20 ENCOUNTER — Encounter (HOSPITAL_COMMUNITY): Payer: Self-pay | Admitting: Obstetrics & Gynecology

## 2015-12-09 ENCOUNTER — Other Ambulatory Visit: Payer: Self-pay | Admitting: Obstetrics & Gynecology

## 2016-02-01 ENCOUNTER — Other Ambulatory Visit: Payer: Self-pay | Admitting: Obstetrics & Gynecology

## 2016-02-25 ENCOUNTER — Ambulatory Visit (INDEPENDENT_AMBULATORY_CARE_PROVIDER_SITE_OTHER): Payer: BLUE CROSS/BLUE SHIELD | Admitting: Family Medicine

## 2016-02-25 VITALS — BP 120/86 | HR 101 | Temp 98.0°F | Resp 18 | Ht 63.0 in | Wt 240.0 lb

## 2016-02-25 DIAGNOSIS — J209 Acute bronchitis, unspecified: Secondary | ICD-10-CM | POA: Diagnosis not present

## 2016-02-25 DIAGNOSIS — R112 Nausea with vomiting, unspecified: Secondary | ICD-10-CM

## 2016-02-25 DIAGNOSIS — R059 Cough, unspecified: Secondary | ICD-10-CM

## 2016-02-25 DIAGNOSIS — R05 Cough: Secondary | ICD-10-CM

## 2016-02-25 DIAGNOSIS — G43909 Migraine, unspecified, not intractable, without status migrainosus: Secondary | ICD-10-CM

## 2016-02-25 LAB — POCT CBC
Granulocyte percent: 83.2 %G — AB (ref 37–80)
HEMATOCRIT: 39.2 % (ref 37.7–47.9)
HEMOGLOBIN: 13.8 g/dL (ref 12.2–16.2)
LYMPH, POC: 1.2 (ref 0.6–3.4)
MCH, POC: 29.2 pg (ref 27–31.2)
MCHC: 35.3 g/dL (ref 31.8–35.4)
MCV: 82.7 fL (ref 80–97)
MID (cbc): 0.2 (ref 0–0.9)
MPV: 6.1 fL (ref 0–99.8)
PLATELET COUNT, POC: 310 10*3/uL (ref 142–424)
POC GRANULOCYTE: 7 — AB (ref 2–6.9)
POC LYMPH PERCENT: 14.4 %L (ref 10–50)
POC MID %: 2.4 %M (ref 0–12)
RBC: 4.74 M/uL (ref 4.04–5.48)
RDW, POC: 13.1 %
WBC: 8.4 10*3/uL (ref 4.6–10.2)

## 2016-02-25 LAB — POCT URINALYSIS DIP (MANUAL ENTRY)
GLUCOSE UA: NEGATIVE
Leukocytes, UA: NEGATIVE
Nitrite, UA: NEGATIVE
SPEC GRAV UA: 1.025
Urobilinogen, UA: 0.2
pH, UA: 6

## 2016-02-25 LAB — POC MICROSCOPIC URINALYSIS (UMFC): MUCUS RE: ABSENT

## 2016-02-25 LAB — POCT URINE PREGNANCY: Preg Test, Ur: NEGATIVE

## 2016-02-25 MED ORDER — PROMETHAZINE VC/CODEINE 6.25-5-10 MG/5ML PO SYRP: 5.0000 mL | ORAL_SOLUTION | ORAL | 0 refills | Status: DC

## 2016-02-25 MED ORDER — MUCINEX DM MAXIMUM STRENGTH 60-1200 MG PO TB12: 1.0000 | ORAL_TABLET | Freq: Two times a day (BID) | ORAL | 1 refills | Status: DC

## 2016-02-25 MED ORDER — PROMETHAZINE HCL 25 MG/ML IJ SOLN
25.0000 mg | Freq: Once | INTRAMUSCULAR | Status: AC
  Administered 2016-02-25: 25 mg via INTRAMUSCULAR

## 2016-02-25 MED ORDER — AZITHROMYCIN 250 MG PO TABS: ORAL_TABLET | ORAL | 0 refills | Status: DC

## 2016-02-25 MED ORDER — KETOROLAC TROMETHAMINE 60 MG/2ML IM SOLN
60.0000 mg | Freq: Once | INTRAMUSCULAR | Status: AC
  Administered 2016-02-25: 60 mg via INTRAMUSCULAR

## 2016-02-25 NOTE — Progress Notes (Signed)
Subjective:  By signing my name below, I, Caitlyn Galvan, attest that this documentation has been prepared under the direction and in the presence of Caitlyn Sorenson, MD. Electronically Signed: Stann Galvan, Scribe. 02/25/2016 , 2:37 PM .  Patient was seen in Room 12 .   Patient ID: Caitlyn Galvan, female    DOB: [birth year], 25 y.o.   MRN: [id number] Chief Complaint  Patient presents with  . Cough    Since saturday, productive  . Migraine   Cough  Associated symptoms include headaches and a sore throat. Pertinent negatives include no chest pain, chills, fever, shortness of breath or wheezing.  Migraine   Associated symptoms include coughing, nausea, a sore throat and vomiting. Pertinent negatives include no fever.   Caitlyn Galvan is a 25 y.o. female who presents to [facility] complaining of productive cough with nausea and vomiting that started 2 days ago. Patient states that work has been hard (works as a Production designer, theatre/television/film at [employer]), and she's been feeling lightheaded yesterday while at work. She mentions having decreased appetite; she ate a salad yesterday and reports an episode of diarrhea, but notes normal after eating salads. She's been having sore throat but believes it's from persistent coughs. She mentions having 1 episode of vomiting yesterday. She denies heart burn or indigestion. She denies wheezing, shortness of breath, leg swelling, chest pain, fever, chills, syncope, or sinus pressure. She denies history of asthma or seasonal allergies. She's a former smoker, but quit 2 years ago.   Patient also reports having headache migraines. She is followed by [facility] [address] and received a steroid shot on July 5th, but says not much relief. She's had similar migraines in the past, no different with current symptoms. She denies taking any OTC medications for her headaches or for her nausea. She takes zonisamide for migraine prevention and baclofen when her migraines are  really bad.   Past Medical History:  Diagnosis Date  . Anxiety   . Depression   . Headache   . No pertinent past medical history    Prior to Admission medications   Medication Sig Start Date End Date Taking? Authorizing Provider  baclofen (LIORESAL) 10 MG tablet Take 10 mg by mouth 3 (three) times daily.   Yes Historical Provider, MD  MONONESSA 0.25-35 MG-MCG tablet TAKE 1 TABLET BY MOUTH EVERY DAY 02/01/16  Yes Ugonna A Anyanwu, MD  zonisamide (ZONEGRAN) 100 MG capsule Take 100 mg by mouth daily.   Yes Historical Provider, MD  oxyCODONE-acetaminophen (ROXICET) 5-325 MG tablet Take 1 tablet by mouth every 4 (four) hours as needed for severe pain. Patient not taking: Reported on 02/25/2016 07/19/15   Mitchel Honour, DO   No Known Allergies  Review of Systems  Constitutional: Positive for appetite change and fatigue. Negative for chills and fever.  HENT: Positive for sore throat.   Respiratory: Positive for cough. Negative for shortness of breath and wheezing.   Cardiovascular: Negative for chest pain and leg swelling.  Gastrointestinal: Positive for diarrhea, nausea and vomiting.  Neurological: Positive for light-headedness and headaches.       Objective:   Physical Exam  Constitutional: She is oriented to person, place, and time. She appears well-developed and well-nourished. No distress.  HENT:  Head: Normocephalic and atraumatic.  Right Ear: Tympanic membrane is scarred. A middle ear effusion is present.  Left Ear: Tympanic membrane is injected.  Nose: Mucosal edema and rhinorrhea (Left worse than right) present. Right sinus exhibits no maxillary sinus  tenderness and no frontal sinus tenderness. Left sinus exhibits no maxillary sinus tenderness and no frontal sinus tenderness.  Mouth/Throat: Oropharynx is clear and moist.  Eyes: EOM are normal. Pupils are equal, round, and reactive to light.  Neck: Neck supple. No thyromegaly present.  Cardiovascular: Regular rhythm.   Tachycardia present.   Pulmonary/Chest: Effort normal. No respiratory distress. She has rales (inspiratory and expiratory) in the left lower field.  Abdominal: Soft. Bowel sounds are normal. She exhibits no distension. There is no hepatosplenomegaly. There is no tenderness. There is no CVA tenderness.  Musculoskeletal: Normal range of motion.  Lymphadenopathy:    She has no cervical adenopathy.  Neurological: She is alert and oriented to person, place, and time.  Skin: Skin is warm and dry.  Psychiatric: She has a normal mood and affect. Her behavior is normal.  Nursing note and vitals reviewed.   BP 120/86   Pulse (!) 101   Temp 98 F (36.7 C) (Oral)   Resp 18   Ht 5\' 3"  (1.6 m)   Wt 240 lb (108.9 kg)   LMP 02/02/2016   SpO2 100%   Breastfeeding? Unknown   BMI 42.51 kg/m    Results for orders placed or performed in visit on 02/25/16  POCT urine pregnancy  Result Value Ref Range   Preg Test, Ur Negative Negative  POCT urinalysis dipstick  Result Value Ref Range   Color, UA yellow yellow   Clarity, UA clear clear   Glucose, UA negative negative   Bilirubin, UA small (A) negative   Ketones, POC UA trace (5) (A) negative   Spec Grav, UA 1.025    Blood, UA trace-lysed (A) negative   pH, UA 6.0    Protein Ur, POC trace (A) negative   Urobilinogen, UA 0.2    Nitrite, UA Negative Negative   Leukocytes, UA Negative Negative  POCT CBC  Result Value Ref Range   WBC 8.4 4.6 - 10.2 K/uL   Lymph, poc 1.2 0.6 - 3.4   POC LYMPH PERCENT 14.4 10 - 50 %L   MID (cbc) 0.2 0 - 0.9   POC MID % 2.4 0 - 12 %M   POC Granulocyte 7.0 (A) 2 - 6.9   Granulocyte percent 83.2 (A) 37 - 80 %G   RBC 4.74 4.04 - 5.48 M/uL   Hemoglobin 13.8 12.2 - 16.2 g/dL   HCT, POC 26.8 34.1 - 47.9 %   MCV 82.7 80 - 97 fL   MCH, POC 29.2 27 - 31.2 pg   MCHC 35.3 31.8 - 35.4 g/dL   RDW, POC 96.2 %   Platelet Count, POC 310 142 - 424 K/uL   MPV 6.1 0 - 99.8 fL  POCT Microscopic Urinalysis ([facility])  Result  Value Ref Range   WBC,UR,HPF,POC Few (A) None WBC/hpf   RBC,UR,HPF,POC None None RBC/hpf   Bacteria None None, Too numerous to count   Mucus Absent Absent   Epithelial Cells, UR Per Microscopy Few (A) None, Too numerous to count cells/hpf       Assessment & Plan:   1. Cough   2. Non-intractable vomiting with nausea, vomiting of unspecified type   3. Migraine without status migrainosus, not intractable, unspecified migraine type  - acute migraine x 24 hrs treated with IM promethazine and toradol x 1 now.   Suspect bronchitis with pnd causing nausea.  If no improvement in 2-3d, RTC for further eval with CXR   Orders Placed This Encounter  Procedures  .  POCT urine pregnancy  . POCT urinalysis dipstick  . POCT CBC  . POCT Microscopic Urinalysis ([facility])    Meds ordered this encounter  Medications  . zonisamide (ZONEGRAN) 100 MG capsule    Sig: Take 100 mg by mouth daily.  . baclofen (LIORESAL) 10 MG tablet    Sig: Take 10 mg by mouth 3 (three) times daily.  [name] ketorolac (TORADOL) injection 60 mg  . promethazine (PHENERGAN) injection 25 mg  . Promethazine-Phenyleph-Codeine (PROMETHAZINE VC/CODEINE) 6.25-5-10 MG/5ML SYRP    Sig: Take 5-10 mLs by mouth every 4 (four) hours.    Dispense:  180 mL    Refill:  0  . azithromycin (ZITHROMAX) 250 MG tablet    Sig: Take 2 tabs PO x 1 dose, then 1 tab PO QD x 4 days    Dispense:  6 tablet    Refill:  0  . Dextromethorphan-Guaifenesin (MUCINEX DM MAXIMUM STRENGTH) 60-1200 MG TB12    Sig: Take 1 tablet by mouth every 12 (twelve) hours.    Dispense:  14 each    Refill:  1    I personally performed the services described in this documentation, which was scribed in my presence. The recorded information has been reviewed and considered, and addended by me as needed.   Caitlyn Galvan, M.D.  [facility] [address] [place], [place] [address] [phone] phone [phone] fax  02/25/16 9:16 PM

## 2016-02-25 NOTE — Patient Instructions (Addendum)
Meds ordered this encounter  Medications  . zonisamide (ZONEGRAN) 100 MG capsule    Sig: Take 100 mg by mouth daily.  . baclofen (LIORESAL) 10 MG tablet    Sig: Take 10 mg by mouth 3 (three) times daily.  [name] ketorolac (TORADOL) injection 60 mg  . promethazine (PHENERGAN) injection 25 mg  . Promethazine-Phenyleph-Codeine (PROMETHAZINE VC/CODEINE) 6.25-5-10 MG/5ML SYRP    Sig: Take 5-10 mLs by mouth every 4 (four) hours.    Dispense:  180 mL    Refill:  0  . azithromycin (ZITHROMAX) 250 MG tablet    Sig: Take 2 tabs PO x 1 dose, then 1 tab PO QD x 4 days    Dispense:  6 tablet    Refill:  0  . Dextromethorphan-Guaifenesin (MUCINEX DM MAXIMUM STRENGTH) 60-1200 MG TB12    Sig: Take 1 tablet by mouth every 12 (twelve) hours.    Dispense:  14 each    Refill:  1   If your symptoms persist or worsen, please come back for further evaluation.   IF you received an x-ray today, you will receive an invoice from [facility]. Please contact [facility] at [phone] with questions or concerns regarding your invoice.   IF you received labwork today, you will receive an invoice from [facility]. Please contact [facility] at [phone] with questions or concerns regarding your invoice.   Our billing staff will not be able to assist you with questions regarding bills from these companies.  You will be contacted with the lab results as soon as they are available. The fastest way to get your results is to activate your My Chart account. Instructions are located on the last page of this paperwork. If you have not heard from [place] regarding the results in 2 weeks, please contact this office.      Acute Bronchitis Bronchitis is inflammation of the airways that extend from the windpipe into the lungs (bronchi). The inflammation often causes mucus to develop. This leads to a cough, which is the most common symptom of bronchitis.  In acute bronchitis, the  condition usually develops suddenly and goes away over time, usually in a couple weeks. Smoking, allergies, and asthma can make bronchitis worse. Repeated episodes of bronchitis may cause further lung problems.  CAUSES Acute bronchitis is most often caused by the same virus that causes a cold. The virus can spread from person to person (contagious) through coughing, sneezing, and touching contaminated objects. SIGNS AND SYMPTOMS   Cough.   Fever.   Coughing up mucus.   Body aches.   Chest congestion.   Chills.   Shortness of breath.   Sore throat.  DIAGNOSIS  Acute bronchitis is usually diagnosed through a physical exam. Your health care provider will also ask you questions about your medical history. Tests, such as chest X-rays, are sometimes done to rule out other conditions.  TREATMENT  Acute bronchitis usually goes away in a couple weeks. Oftentimes, no medical treatment is necessary. Medicines are sometimes given for relief of fever or cough. Antibiotic medicines are usually not needed but may be prescribed in certain situations. In some cases, an inhaler may be recommended to help reduce shortness of breath and control the cough. A cool mist vaporizer may also be used to help thin bronchial secretions and make it easier to clear the chest.  HOME CARE INSTRUCTIONS  Get plenty of rest.   Drink enough fluids to keep your urine clear or pale yellow (unless you  have a medical condition that requires fluid restriction). Increasing fluids may help thin your respiratory secretions (sputum) and reduce chest congestion, and it will prevent dehydration.   Take medicines only as directed by your health care provider.  If you were prescribed an antibiotic medicine, finish it all even if you start to feel better.  Avoid smoking and secondhand smoke. Exposure to cigarette smoke or irritating chemicals will make bronchitis worse. If you are a smoker, consider using nicotine gum or  skin patches to help control withdrawal symptoms. Quitting smoking will help your lungs heal faster.   Reduce the chances of another bout of acute bronchitis by washing your hands frequently, avoiding people with cold symptoms, and trying not to touch your hands to your mouth, nose, or eyes.   Keep all follow-up visits as directed by your health care provider.  SEEK MEDICAL CARE IF: Your symptoms do not improve after 1 week of treatment.  SEEK IMMEDIATE MEDICAL CARE IF:  You develop an increased fever or chills.   You have chest pain.   You have severe shortness of breath.  You have bloody sputum.   You develop dehydration.  You faint or repeatedly feel like you are going to pass out.  You develop repeated vomiting.  You develop a severe headache. MAKE SURE YOU:   Understand these instructions.  Will watch your condition.  Will get help right away if you are not doing well or get worse.   This information is not intended to replace advice given to you by your health care provider. Make sure you discuss any questions you have with your health care provider.   Document Released: 08/28/2004 Document Revised: 08/11/2014 Document Reviewed: 01/11/2013 Elsevier Interactive Patient Education [employer]

## 2016-02-29 ENCOUNTER — Other Ambulatory Visit: Payer: Self-pay | Admitting: Obstetrics & Gynecology

## 2016-04-09 ENCOUNTER — Ambulatory Visit (INDEPENDENT_AMBULATORY_CARE_PROVIDER_SITE_OTHER): Payer: BLUE CROSS/BLUE SHIELD | Admitting: Family Medicine

## 2016-04-09 ENCOUNTER — Encounter: Payer: Self-pay | Admitting: Family Medicine

## 2016-04-09 VITALS — BP 120/84 | HR 88 | Temp 98.0°F | Resp 17 | Ht 63.0 in | Wt 233.0 lb

## 2016-04-09 DIAGNOSIS — Z113 Encounter for screening for infections with a predominantly sexual mode of transmission: Secondary | ICD-10-CM

## 2016-04-09 DIAGNOSIS — L298 Other pruritus: Secondary | ICD-10-CM

## 2016-04-09 DIAGNOSIS — A499 Bacterial infection, unspecified: Secondary | ICD-10-CM

## 2016-04-09 DIAGNOSIS — N760 Acute vaginitis: Secondary | ICD-10-CM | POA: Diagnosis not present

## 2016-04-09 DIAGNOSIS — N898 Other specified noninflammatory disorders of vagina: Secondary | ICD-10-CM

## 2016-04-09 DIAGNOSIS — R339 Retention of urine, unspecified: Secondary | ICD-10-CM

## 2016-04-09 DIAGNOSIS — B9689 Other specified bacterial agents as the cause of diseases classified elsewhere: Secondary | ICD-10-CM

## 2016-04-09 LAB — POCT URINALYSIS DIP (MANUAL ENTRY)
BILIRUBIN UA: NEGATIVE
Blood, UA: NEGATIVE
GLUCOSE UA: NEGATIVE
Ketones, POC UA: NEGATIVE
Leukocytes, UA: NEGATIVE
NITRITE UA: NEGATIVE
Protein Ur, POC: NEGATIVE
Spec Grav, UA: 1.02
UROBILINOGEN UA: 0.2
pH, UA: 8.5

## 2016-04-09 LAB — POCT WET + KOH PREP
TRICH BY WET PREP: ABSENT
YEAST BY KOH: ABSENT
YEAST BY WET PREP: ABSENT

## 2016-04-09 LAB — POC MICROSCOPIC URINALYSIS (UMFC): Mucus: ABSENT

## 2016-04-09 MED ORDER — METRONIDAZOLE 0.75 % VA GEL: 1.0000 | Freq: Two times a day (BID) | VAGINAL | 0 refills | Status: AC

## 2016-04-09 NOTE — Patient Instructions (Addendum)
    Start  . metroNIDAZOLE (METROGEL VAGINAL) 0.75 % vaginal gel    Sig: Place 1 Applicatorful vaginally 2 (two) times daily for 5 days.   Return if symptoms do not improve.  Godfrey PickKimberly S. Tiburcio PeaHarris, MSN, FNP-C [facility]  IF you received an x-ray today, you will receive an invoice from [facility]. Please contact [facility] at [phone] with questions or concerns regarding your invoice.   IF you received labwork today, you will receive an invoice from [facility]. Please contact [facility] at [phone] with questions or concerns regarding your invoice.   Our billing staff will not be able to assist you with questions regarding bills from these companies.  You will be contacted with the lab results as soon as they are available. The fastest way to get your results is to activate your My Chart account. Instructions are located on the last page of this paperwork. If you have not heard from us regarding the results in 2 weeks, please contact this office.         Bacterial Vaginosis Bacterial vaginosis is an infection of the vagina. It happens when too many germs (bacteria) grow in the vagina. Having this infection puts you at risk for getting other infections from sex. Treating this infection can help lower your risk for other infections, such as:   Chlamydia.  Gonorrhea.  HIV.  Herpes. HOME CARE  Take your medicine as told by your doctor.  Finish your medicine even if you start to feel better.  Tell your sex partner that you have an infection. They should see their doctor for treatment.  During treatment:  Avoid sex or use condoms correctly.  Do not douche.  Do not drink alcohol unless your doctor tells you it is ok.  Do not breastfeed unless your doctor tells you it is ok. GET HELP IF:  You are not getting better after 3 days of treatment.  You have more grey fluid  (discharge) coming from your vagina than before.  You have more pain than before.  You have a fever. MAKE SURE YOU:   Understand these instructions.  Will watch your condition.  Will get help right away if you are not doing well or get worse.   This information is not intended to replace advice given to you by your health care provider. Make sure you discuss any questions you have with your health care provider.   Document Released: 04/29/2008 Document Revised: 08/11/2014 Document Reviewed: 03/02/2013 Elsevier Interactive Patient Education [employer] Elsevier Inc.

## 2016-04-09 NOTE — Progress Notes (Addendum)
Patient ID: Dot LanesMarissa R Chmiel-Reap, female    DOB: 10/11/1990, 25 y.o.   MRN: [id number]  PCP: Rockne Coonshao Phuong Le, DO  Chief Complaint  Patient presents with  . Urinary Retention  . Vaginal Itching    Subjective:   HPI 25 year old presents for evaluation of increased frequency in urination. She reports urgency, cloudy, and darken urine. No measurable fever, although experienced one episode of a "cold sweat".   Denies burning or noticeable urine odor change.   She has used over the counter AZO's which she reports as improving urgency symptoms.  Reports some mild low back pain.  Requests screening for STD. LMPD 27th of August .  She also request GC/Chlamydia testing.  Denies a known recent exposure.  Reports recent unprotected sex, has an IUD for pregnancy prevention.   . Social History   Social History  . Marital status: Single    Spouse name: N/A  . Number of children: N/A  . Years of education: N/A   Occupational History  . Not on file.   Social History Main Topics  . Smoking status: Former Smoker    Packs/day: 0.50    Years: 2.00    Types: Cigarettes, E-cigarettes    Quit date: 06/16/2014  . Smokeless tobacco: Never Used     Comment: 1/2 pack a week  . Alcohol use 0.0 oz/week     Comment: occasionally  . Drug use: No     Comment: in the past  . Sexual activity: Yes    Partners: Male    Birth control/ protection: IUD     Comment: Mirena   Other Topics Concern  . Not on file   Social History Narrative  . No narrative on file    .No family history on file.  Review of Systems  Constitutional:       SEE HPI  Genitourinary:       SEE HPI     Patient Active Problem List   Diagnosis Date Noted  . Irregular menses 10/19/2014  . Severe obesity (BMI >= 40) (HCC) 10/19/2014     Prior to Admission medications   Medication Sig Start Date End Date Taking? Authorizing Provider  baclofen (LIORESAL) 10 MG tablet Take 10 mg by mouth 3 (three) times daily.   Yes  Historical Provider, MD  zonisamide (ZONEGRAN) 100 MG capsule Take 100 mg by mouth daily.   Yes Historical Provider, MD  MONONESSA 0.25-35 MG-MCG tablet TAKE 1 TABLET BY MOUTH EVERY DAY Patient not taking: Reported on 04/09/2016 02/29/16   Tereso NewcomerUgonna A Anyanwu, MD  oxyCODONE-acetaminophen (ROXICET) 5-325 MG tablet Take 1 tablet by mouth every 4 (four) hours as needed for severe pain. Patient not taking: Reported on 02/25/2016 07/19/15   Mitchel HonourMegan Morris, DO  Promethazine-Phenyleph-Codeine (PROMETHAZINE VC/CODEINE) 6.25-5-10 MG/5ML SYRP Take 5-10 mLs by mouth every 4 (four) hours. Patient not taking: Reported on 04/09/2016 02/25/16   Sherren MochaEva N Shaw, MD    No Known Allergies     Objective:  Physical Exam  Constitutional: She is oriented to person, place, and time. She appears well-developed and well-nourished.  HENT:  Head: Normocephalic and atraumatic.  Left Ear: External ear normal.  Genitourinary: Vaginal discharge found.  Genitourinary Comments: Normal female external genitalia without lesion. No inguinal lymphadenopathy. Vaginal mucosa is pink and moist without lesions. Cervix coated with thick white mucoid discharge, non friable.Specimen for Wet Prep obtained.  Musculoskeletal: Normal range of motion.  Neurological: She is alert and oriented to person, place, and  time.  Skin: Skin is warm and dry.  Psychiatric: She has a normal mood and affect. Her behavior is normal. Judgment and thought content normal.    Vitals:   04/09/16 1141  BP: 120/84  Pulse: 88  Resp: 17  Temp: 98 F (36.7 C)     Assessment & Plan:  .1. Urinary urgency, likely related to symptoms associated with bacterial vaginosis  - POCT Microscopic Urinalysis ([facility]) - POCT urinalysis dipstick  2. Screening examination for STD (sexually transmitted disease) Anticipatory guidance provided regarding barrier protection with sexual activity. - GC/Chlamydia Probe Amp-negative  3. Vaginal itching, likely related to vaginitis  -  POCT Wet + KOH Prep-unrearkable  Plan: . metroNIDAZOLE (METROGEL VAGINAL) 0.75 % vaginal gel    Sig: Place 1 Applicatorful vaginally 2 (two) times daily.   Godfrey Pick. Tiburcio Pea, MSN, FNP-C [facility]

## 2016-04-10 ENCOUNTER — Encounter: Payer: Self-pay | Admitting: Family Medicine

## 2016-04-10 LAB — GC/CHLAMYDIA PROBE AMP
CT Probe RNA: NOT DETECTED
GC Probe RNA: NOT DETECTED

## 2016-04-10 NOTE — Progress Notes (Signed)
Dear [name],  Below are the results from your recent visit. Your results were as expected.  Resulted Orders  GC/Chlamydia Probe Amp  Result Value Ref Range   CT Probe RNA NOT DETECTED      Comment:                        **Normal Reference Range: NOT DETECTED**   This test was performed using the APTIMA COMBO2 Assay ([employer]   The analytical performance characteristics of this assay, when used to test SurePath specimens have been determined by [facility]      GC Probe RNA NOT DETECTED      Comment:                        **Normal Reference Range: NOT DETECTED**   This test was performed using the APTIMA COMBO2 Assay ([employer]   The analytical performance characteristics of this assay, when used to test SurePath specimens have been determined by [facility]      Narrative   Performed at:  [facility]                [address], [address]                [place], [place] [id number]     If you have any questions or concerns, please don't hesitate to call.  Sincerely,   Caitlyn CourtsKimberly Bode Pieper, FNP

## 2016-07-12 IMAGING — US US OB COMP LESS 14 WK
1 series · 15 of 28 positions shown · non-contrast
Comparison: None.

CLINICAL DATA: Pregnant patient with vaginal bleeding in
first-trimester pregnancy. Patient reports prior office ultrasound
07/09/2015 demonstrating 5-6 week intrauterine pregnancy.

EXAM:
OBSTETRIC <14 WK US AND TRANSVAGINAL OB US
TECHNIQUE: Both transabdominal and transvaginal ultrasound examinations were
performed for complete evaluation of the gestation as well as the
maternal uterus, adnexal regions, and pelvic cul-de-sac.
Transvaginal technique was performed to assess early pregnancy.

[Series 1: us ob comp less 14 wk · 15 of 59 slices shown]
[im 1/59]
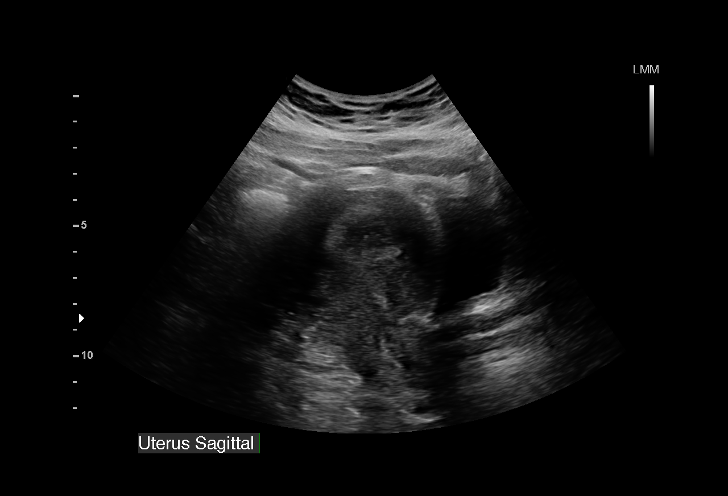
[im 5/59]
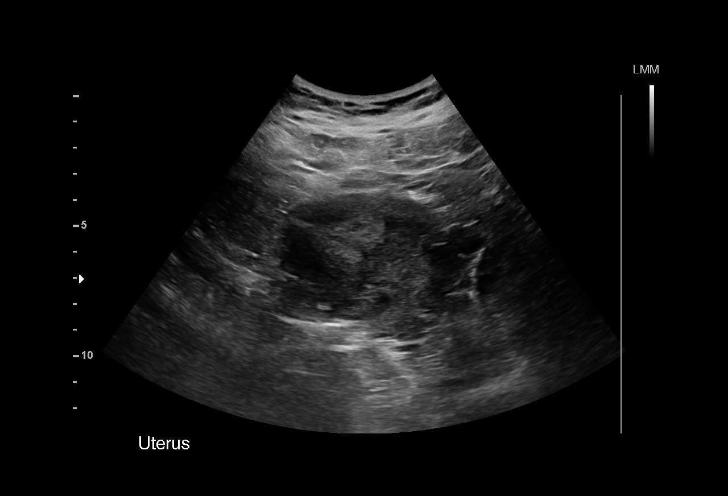
[im 9/59]
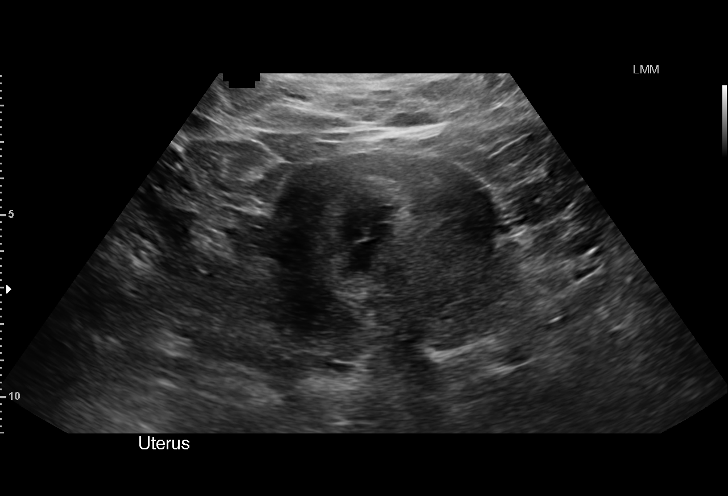
[im 13/59]
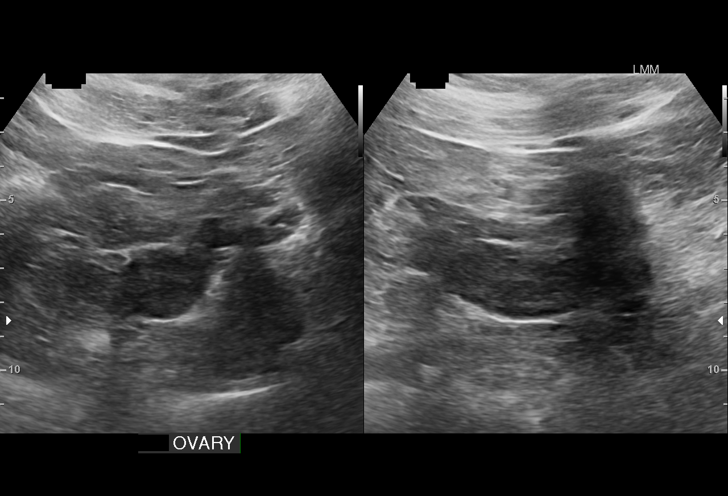
[im 18/59]
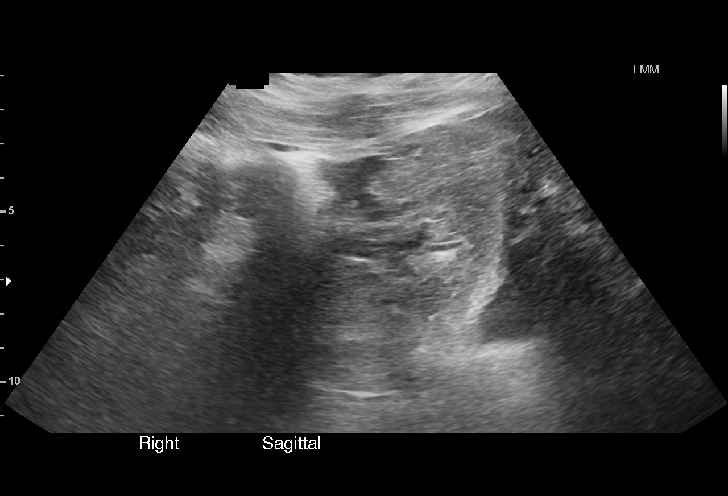
[im 22/59]
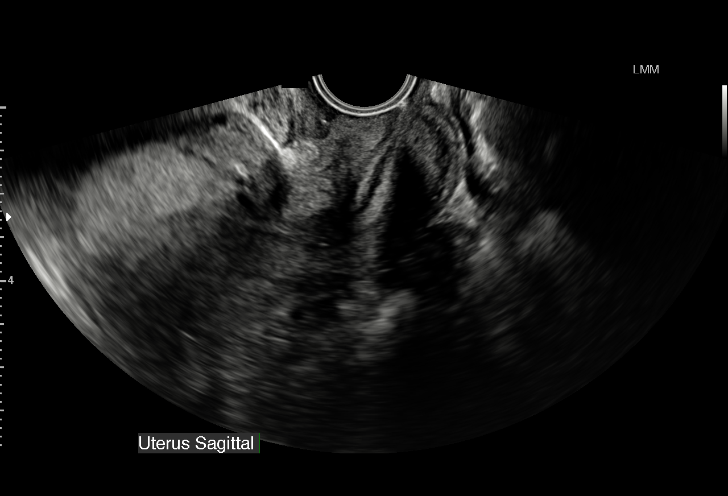
[im 26/59]
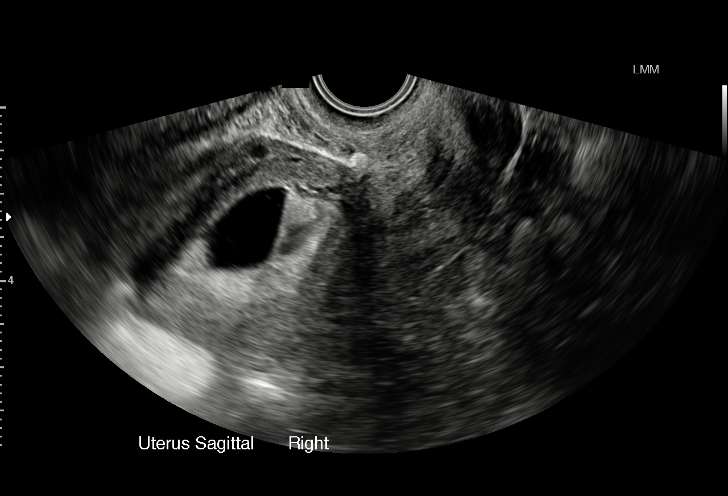
[im 31/59]
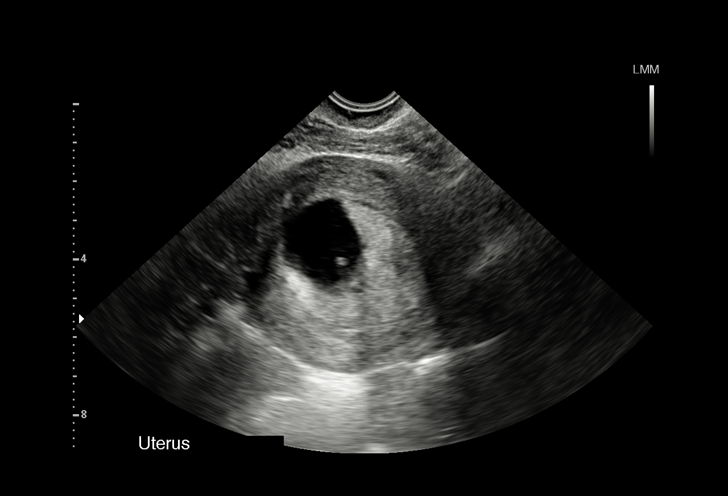
[im 33/59]
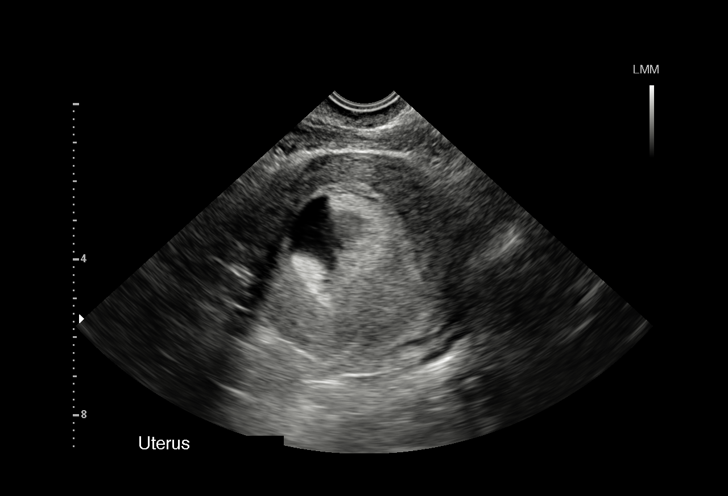
[im 37/59]
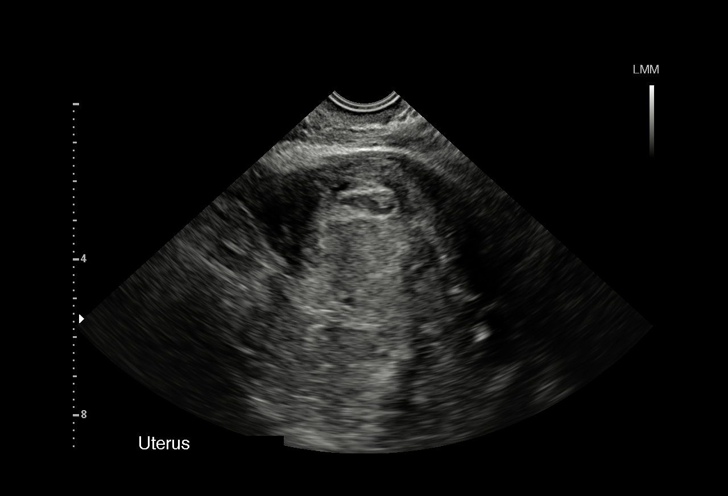
[im 41/59]
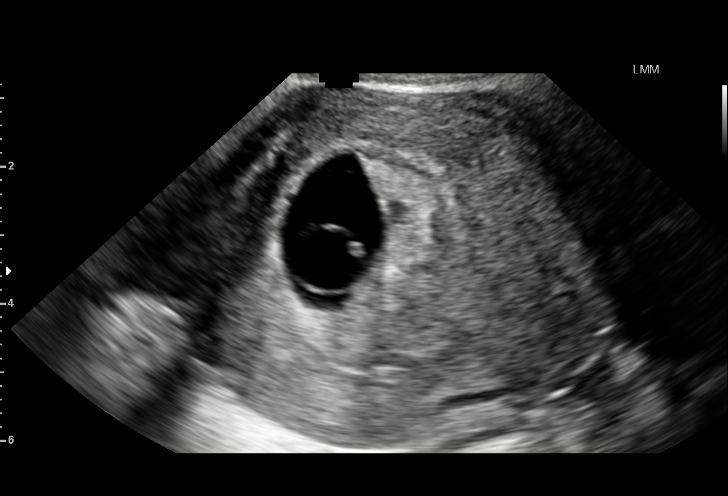
[im 46/59]
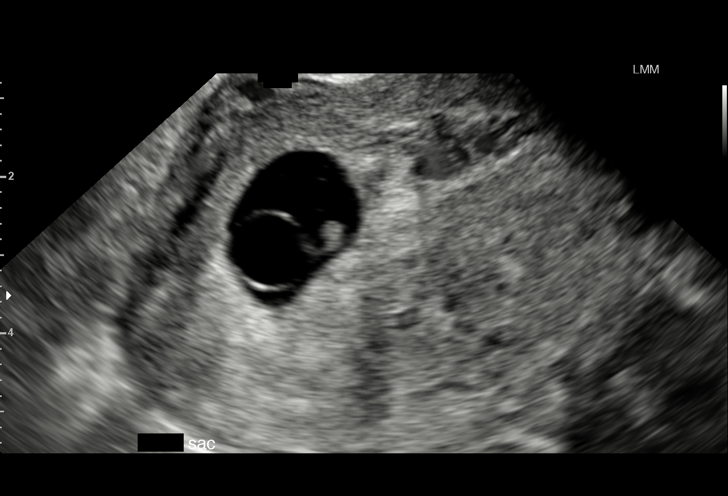
[im 50/59]
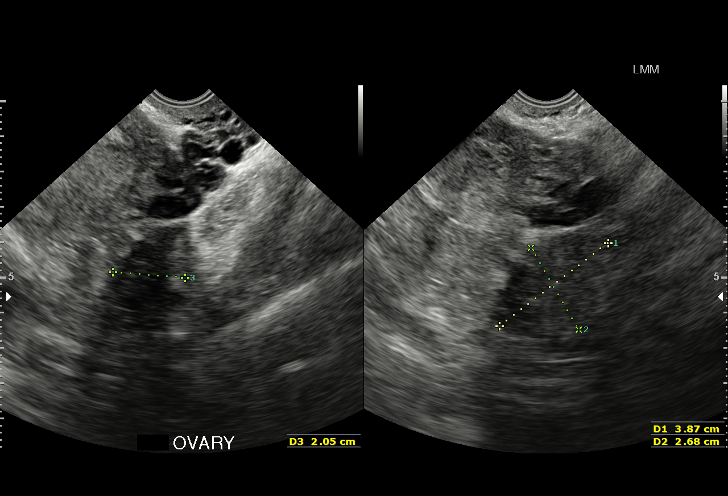
[im 54/59]
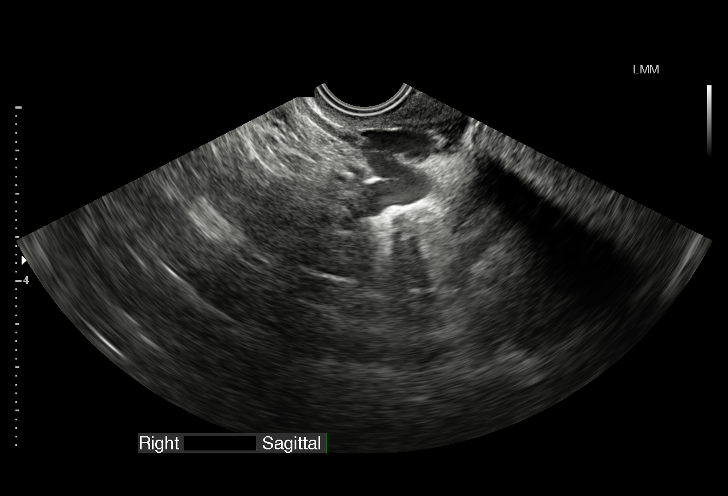
[im 59/59]
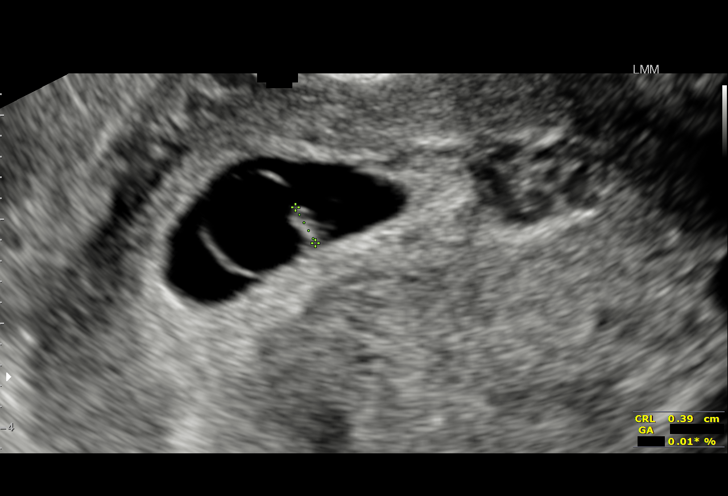

[15 of 28 positions shown; findings below may reference images not displayed]

FINDINGS: Intrauterine gestational sac: Visualized, normal in shape.

Yolk sac:  Questionably identified,? collapsed.

Embryo:  Present.

Cardiac Activity: Not present.

CRL:  3.9   mm   6 w   1 d                  US EDC: 03/07/2016

Maternal uterus/adnexae: There is no subchorionic hemorrhage. The
right ovary is not seen. The left ovary appears normal best seen
transabdominally. There is no pelvic free fluid.
IMPRESSION: Intrauterine pregnancy with fetal pole, estimated gestational age
based on crown-rump length of 6 weeks 1 day, however no fetal
cardiac activity demonstrated. A yolk sac is questionably identified
and possibly collapsed. Recommend trending of beta HCG, and
sonographic follow-up as indicated clinically.

## 2016-08-06 ENCOUNTER — Inpatient Hospital Stay (HOSPITAL_COMMUNITY)
Admission: AD | Admit: 2016-08-06 | Discharge: 2016-08-06 | Disposition: A | Discharge status: Home / Self Care | Payer: BLUE CROSS/BLUE SHIELD | Source: Ambulatory Visit | Attending: Obstetrics and Gynecology | Admitting: Obstetrics and Gynecology

## 2016-08-06 ENCOUNTER — Encounter (HOSPITAL_COMMUNITY): Payer: Self-pay | Admitting: *Deleted

## 2016-08-06 DIAGNOSIS — Z3491 Encounter for supervision of normal pregnancy, unspecified, first trimester: Secondary | ICD-10-CM

## 2016-08-06 DIAGNOSIS — R109 Unspecified abdominal pain: Secondary | ICD-10-CM | POA: Insufficient documentation

## 2016-08-06 DIAGNOSIS — Z87891 Personal history of nicotine dependence: Secondary | ICD-10-CM | POA: Insufficient documentation

## 2016-08-06 DIAGNOSIS — Z3A10 10 weeks gestation of pregnancy: Secondary | ICD-10-CM | POA: Insufficient documentation

## 2016-08-06 DIAGNOSIS — O26891 Other specified pregnancy related conditions, first trimester: Secondary | ICD-10-CM | POA: Diagnosis present

## 2016-08-06 LAB — URINALYSIS, ROUTINE W REFLEX MICROSCOPIC
Bilirubin Urine: NEGATIVE
GLUCOSE, UA: NEGATIVE mg/dL
Hgb urine dipstick: NEGATIVE
Ketones, ur: 5 mg/dL — AB
LEUKOCYTES UA: NEGATIVE
Nitrite: NEGATIVE
PH: 5 (ref 5.0–8.0)
PROTEIN: NEGATIVE mg/dL
Specific Gravity, Urine: 1.028 (ref 1.005–1.030)

## 2016-08-06 NOTE — MAU Note (Signed)
Having some "uncomfortability," just on one side, LLQ.  Pain is off and on.  Doesn't know what is going on. Started on Monday, not getting worse, not getting any better though.

## 2016-08-06 NOTE — MAU Provider Note (Signed)
History     CSN: [id number]  Arrival date and time: 08/06/16 1726   First Provider Initiated Contact with Patient 08/06/16 1851      Chief Complaint  Patient presents with  . Abdominal Pain   HPI   [name] is a 26 y.o. female G3P1011  @ 6281w5d here in MAU with abdominal pain. The pain started Monday. The pain occurs everyday. The pain comes off and on. Currently the pain feels similar to period cramps. She tried taking tylenol for pain and that helped. The pain is located in both sides of her abdomen, however worse in the LLQ.  Denies vaginal bleeding   Patient was receiving her care at [facility], however she lost her insurance and has transferred her care to [facility].   OB History    Gravida Para Term Preterm AB Living   3 1 1  0 1 1   SAB TAB Ectopic Multiple Live Births   1 0 0 0        Past Medical History:  Diagnosis Date  . Anxiety   . Depression   . Headache   . No pertinent past medical history     Past Surgical History:  Procedure Laterality Date  . ADENOIDECTOMY    . adnoids    . CESAREAN SECTION    . DILATION AND CURETTAGE OF UTERUS    . DILATION AND EVACUATION N/A 07/19/2015   Procedure: DILATATION AND EVACUATION;  Surgeon: Mitchel HonourMegan Morris, DO;  Location: [facility];  Service: Gynecology;  Laterality: N/A;  . tubes in ears    . WISDOM TOOTH EXTRACTION      History reviewed. No pertinent family history.  Social History  Substance Use Topics  . Smoking status: Former Smoker    Packs/day: 0.50    Years: 2.00    Types: Cigarettes, E-cigarettes    Quit date: 06/16/2014  . Smokeless tobacco: Never Used     Comment: 1/2 pack a week  . Alcohol use 0.0 oz/week     Comment: occasionally    Allergies: No Known Allergies  Prescriptions Prior to Admission  Medication Sig Dispense Refill Last Dose  . acetaminophen (TYLENOL) 650 MG CR tablet Take 650 mg by mouth every 8 (eight) hours as needed for pain.   08/05/2016 at Unknown time  .  butalbital-acetaminophen-caffeine (FIORICET, ESGIC) 50-325-40 MG tablet Take 1 tablet by mouth 2 (two) times daily as needed for headache.   Past Week at Unknown time  . Prenatal Vit-Fe Fumarate-FA (PRENATAL MULTIVITAMIN) TABS tablet Take 1 tablet by mouth every morning.   08/06/2016 at Unknown time  . progesterone 200 MG SUPP Place 200 mg vaginally at bedtime.   08/05/2016 at Unknown time  . MONONESSA 0.25-35 MG-MCG tablet TAKE 1 TABLET BY MOUTH EVERY DAY (Patient not taking: Reported on 04/09/2016) 28 tablet 12 Not Taking  . oxyCODONE-acetaminophen (ROXICET) 5-325 MG tablet Take 1 tablet by mouth every 4 (four) hours as needed for severe pain. (Patient not taking: Reported on 02/25/2016) 15 tablet 0 Not Taking  . Promethazine-Phenyleph-Codeine (PROMETHAZINE VC/CODEINE) 6.25-5-10 MG/5ML SYRP Take 5-10 mLs by mouth every 4 (four) hours. (Patient not taking: Reported on 04/09/2016) 180 mL 0 Not Taking   Results for orders placed or performed during the hospital encounter of 08/06/16 (from the past 48 hour(s))  Urinalysis, Routine w reflex microscopic     Status: Abnormal   Collection Time: 08/06/16  5:41 PM  Result Value Ref Range   Color, Urine YELLOW YELLOW  APPearance HAZY (A) CLEAR   Specific Gravity, Urine 1.028 1.005 - 1.030   pH 5.0 5.0 - 8.0   Glucose, UA NEGATIVE NEGATIVE mg/dL   Hgb urine dipstick NEGATIVE NEGATIVE   Bilirubin Urine NEGATIVE NEGATIVE   Ketones, ur 5 (A) NEGATIVE mg/dL   Protein, ur NEGATIVE NEGATIVE mg/dL   Nitrite NEGATIVE NEGATIVE   Leukocytes, UA NEGATIVE NEGATIVE    Review of Systems  Constitutional: Negative for chills and fever.  Gastrointestinal: Negative for constipation, diarrhea, nausea and vomiting.  Genitourinary: Negative for dysuria and urgency.   Physical Exam   Blood pressure 119/79, pulse 82, temperature 98.5 F (36.9 C), temperature source Oral, resp. rate 18, weight 228 lb (103.4 kg), last menstrual period 03/30/2016, unknown if currently  breastfeeding.  Physical Exam  Constitutional: She is oriented to person, place, and time. She appears well-developed and well-nourished. No distress.  HENT:  Head: Normocephalic.  Eyes: Pupils are equal, round, and reactive to light.  GI: Soft. Normal appearance. There is tenderness in the suprapubic area and left lower quadrant. There is rebound. There is no rigidity and no guarding.  Musculoskeletal: Normal range of motion.  Neurological: She is alert and oriented to person, place, and time.  Skin: Skin is warm. She is not diaphoretic.  Psychiatric: Her behavior is normal.   Dilation: Closed Exam by:: Venia Carbon NP   MAU Course  Procedures  None  MDM  + Fetal heart tones via doppler.  UA  Assessment and Plan    A:  1. Abdominal pain in pregnancy, first trimester   2. Fetal heart tones present, first trimester     P:  Discharge home in stable condition Ok to continue tylenol PO as directed on the bottle Return to MAU as needed, if symptoms worsen Normalcy of pregnancy    Duane Lope, NP 08/06/2016 7:10 PM

## 2016-08-06 NOTE — Discharge Instructions (Signed)
Abdominal Pain During Pregnancy °Belly (abdominal) pain is common during pregnancy. Most of the time, it is not a serious problem. Other times, it can be a sign that something is wrong with the pregnancy. Always tell your doctor if you have belly pain. °Follow these instructions at home: °Monitor your belly pain for any changes. The following actions may help you feel better: °· Do not have sex (intercourse) or put anything in your vagina until you feel better. °· Rest until your pain stops. °· Drink clear fluids if you feel sick to your stomach (nauseous). Do not eat solid food until you feel better. °· Only take medicine as told by your doctor. °· Keep all doctor visits as told. °Get help right away if: °· You are bleeding, leaking fluid, or pieces of tissue come out of your vagina. °· You have more pain or cramping. °· You keep throwing up (vomiting). °· You have pain when you pee (urinate) or have blood in your pee. °· You have a fever. °· You do not feel your baby moving as much. °· You feel very weak or feel like passing out. °· You have trouble breathing, with or without belly pain. °· You have a very bad headache and belly pain. °· You have fluid leaking from your vagina and belly pain. °· You keep having watery poop (diarrhea). °· Your belly pain does not go away after resting, or the pain gets worse. °This information is not intended to replace advice given to you by your health care provider. Make sure you discuss any questions you have with your health care provider. °Document Released: 07/09/2009 Document Revised: 02/27/2016 Document Reviewed: 02/17/2013 °Elsevier Interactive Patient Education © 2017 [employer] Inc[employer] ° °

## 2016-08-17 ENCOUNTER — Inpatient Hospital Stay (HOSPITAL_COMMUNITY)
Admission: AD | Admit: 2016-08-17 | Discharge: 2016-08-17 | Disposition: A | Discharge status: Home / Self Care | Payer: BLUE CROSS/BLUE SHIELD | Source: Ambulatory Visit | Attending: Obstetrics & Gynecology | Admitting: Obstetrics & Gynecology

## 2016-08-17 ENCOUNTER — Encounter (HOSPITAL_COMMUNITY): Payer: Self-pay | Admitting: *Deleted

## 2016-08-17 DIAGNOSIS — M7918 Myalgia, other site: Secondary | ICD-10-CM

## 2016-08-17 DIAGNOSIS — J069 Acute upper respiratory infection, unspecified: Secondary | ICD-10-CM

## 2016-08-17 DIAGNOSIS — M791 Myalgia: Secondary | ICD-10-CM | POA: Diagnosis not present

## 2016-08-17 DIAGNOSIS — O99512 Diseases of the respiratory system complicating pregnancy, second trimester: Secondary | ICD-10-CM | POA: Diagnosis not present

## 2016-08-17 DIAGNOSIS — Z87891 Personal history of nicotine dependence: Secondary | ICD-10-CM | POA: Insufficient documentation

## 2016-08-17 DIAGNOSIS — R109 Unspecified abdominal pain: Secondary | ICD-10-CM | POA: Insufficient documentation

## 2016-08-17 DIAGNOSIS — O26891 Other specified pregnancy related conditions, first trimester: Secondary | ICD-10-CM | POA: Insufficient documentation

## 2016-08-17 DIAGNOSIS — Z3A12 12 weeks gestation of pregnancy: Secondary | ICD-10-CM | POA: Diagnosis not present

## 2016-08-17 DIAGNOSIS — R1032 Left lower quadrant pain: Secondary | ICD-10-CM | POA: Diagnosis present

## 2016-08-17 DIAGNOSIS — B9789 Other viral agents as the cause of diseases classified elsewhere: Secondary | ICD-10-CM

## 2016-08-17 LAB — URINALYSIS, ROUTINE W REFLEX MICROSCOPIC
Bilirubin Urine: NEGATIVE
Glucose, UA: NEGATIVE mg/dL
Hgb urine dipstick: NEGATIVE
Ketones, ur: NEGATIVE mg/dL
Leukocytes, UA: NEGATIVE
NITRITE: NEGATIVE
PH: 6 (ref 5.0–8.0)
Protein, ur: NEGATIVE mg/dL
Specific Gravity, Urine: 1.006 (ref 1.005–1.030)

## 2016-08-17 MED ORDER — RANITIDINE HCL 150 MG PO TABS: 150.0000 mg | ORAL_TABLET | Freq: Two times a day (BID) | ORAL | 0 refills | Status: DC

## 2016-08-17 MED ORDER — BENZONATATE 100 MG PO CAPS: 200.0000 mg | ORAL_CAPSULE | Freq: Three times a day (TID) | ORAL | 0 refills | Status: DC | PRN

## 2016-08-17 NOTE — MAU Note (Signed)
Urine sent to lab 

## 2016-08-17 NOTE — MAU Provider Note (Signed)
Chief Complaint: Abdominal Pain   First Provider Initiated Contact with Patient 08/17/16 2240      SUBJECTIVE HPI: Caitlyn Galvan is a 26 y.o. G3P1011 at [redacted]w[redacted]d by LMP who presents to maternity admissions reporting upper abdominal, left flank, and left lower quadrant intermittent burning pain with onset today. She rates her pain 3/10 and reports it is not severe but she wanted to make sure everything with her pregnancy was [facility]. She has an URI recently treated by primary care and has been coughing x 4 days.  She reports nothing makes her pain better or worse, she has not taken any medications.  She is taking Robitussin DM for her URI and her congestion is better but she still has a cough. She was given an amoxicillin Rx but has not started it because was worried about the medication in pregnancy. She denies any fever with her URI or abdominal pain. She denies vaginal bleeding, vaginal itching/burning, urinary symptoms, h/a, dizziness, n/v, or fever/chills.     HPI  Past Medical History:  Diagnosis Date  . Anxiety   . Depression   . Headache   . No pertinent past medical history    Past Surgical History:  Procedure Laterality Date  . ADENOIDECTOMY    . adnoids    . CESAREAN SECTION    . DILATION AND CURETTAGE OF UTERUS    . DILATION AND EVACUATION N/A 07/19/2015   Procedure: DILATATION AND EVACUATION;  Surgeon: Mitchel Honour, DO;  Location: [facility];  Service: Gynecology;  Laterality: N/A;  . tubes in ears    . WISDOM TOOTH EXTRACTION     Social History   Social History  . Marital status: Single    Spouse name: N/A  . Number of children: N/A  . Years of education: N/A   Occupational History  . Not on file.   Social History Main Topics  . Smoking status: Former Smoker    Packs/day: 0.50    Years: 2.00    Types: Cigarettes, E-cigarettes    Quit date: 06/16/2014  . Smokeless tobacco: Former Neurosurgeon     Comment: 1/2 pack a week  . Alcohol use 0.0 oz/week     Comment:  occasionally; not while preg  . Drug use: No     Comment: oct 2017  . Sexual activity: Yes    Partners: Male    Birth control/ protection: None   Other Topics Concern  . Not on file   Social History Narrative  . No narrative on file   No current facility-administered medications on file prior to encounter.    Current Outpatient Prescriptions on File Prior to Encounter  Medication Sig Dispense Refill  . acetaminophen (TYLENOL) 650 MG CR tablet Take 650 mg by mouth every 8 (eight) hours as needed for pain.    . butalbital-acetaminophen-caffeine (FIORICET, ESGIC) 50-325-40 MG tablet Take 1 tablet by mouth 2 (two) times daily as needed for headache.    . Prenatal Vit-Fe Fumarate-FA (PRENATAL MULTIVITAMIN) TABS tablet Take 1 tablet by mouth every morning.    . progesterone 200 MG SUPP Place 200 mg vaginally at bedtime.     No Known Allergies  ROS:  Review of Systems  Constitutional: Negative for chills, fatigue and fever.  HENT: Positive for congestion, postnasal drip and rhinorrhea.   Respiratory: Positive for cough. Negative for shortness of breath.   Cardiovascular: Negative for chest pain.  Gastrointestinal: Positive for abdominal pain. Negative for constipation, diarrhea, nausea and vomiting.  Genitourinary: Negative  for difficulty urinating, dysuria, flank pain, pelvic pain, vaginal bleeding, vaginal discharge and vaginal pain.  Neurological: Negative for dizziness and headaches.  Psychiatric/Behavioral: Negative.      I have reviewed patient's Past Medical Hx, Surgical Hx, Family Hx, Social Hx, medications and allergies.   Physical Exam   Patient Vitals for the past 24 hrs:  BP Temp Temp src Pulse Resp SpO2  08/17/16 2009 120/66 - - - - -  08/17/16 2008 - 98 F (36.7 C) Oral 84 17 98 %   Constitutional: Well-developed, well-nourished female in no acute distress.  Cardiovascular: normal rate Respiratory: normal effort GI: Abd soft, non-tender. Pos BS x 4 MS:  Extremities nontender, no edema, normal ROM Neurologic: Alert and oriented x 4.  GU: Neg CVAT.  PELVIC EXAM: Deferred  FHT 166 by doppler  LAB RESULTS Results for orders placed or performed during the hospital encounter of 08/17/16 (from the past 24 hour(s))  Urinalysis, Routine w reflex microscopic     Status: Abnormal   Collection Time: 08/17/16  7:52 PM  Result Value Ref Range   Color, Urine STRAW (A) YELLOW   APPearance CLEAR CLEAR   Specific Gravity, Urine 1.006 1.005 - 1.030   pH 6.0 5.0 - 8.0   Glucose, UA NEGATIVE NEGATIVE mg/dL   Hgb urine dipstick NEGATIVE NEGATIVE   Bilirubin Urine NEGATIVE NEGATIVE   Ketones, ur NEGATIVE NEGATIVE mg/dL   Protein, ur NEGATIVE NEGATIVE mg/dL   Nitrite NEGATIVE NEGATIVE   Leukocytes, UA NEGATIVE NEGATIVE       IMAGING  Bedside US reveals IUP with good fetal movement, FHR 162, and subjectively normal fluid level   MAU Management/MDM: Ordered labs and reviewed results.  Likely pain is either digestive because of the burning or musculoskeletal r/t coughing, or both. Will treat with Zantac 150 mg BID PRN, Tessalon Perles, and pt given safe OTC medications list today in MAU.  Advised pt to hold off on amoxicillin, not because it is unsafe in pregnancy but because her illness is likely viral.  She may take abx if URI persists >7-10 days or is associated with fever or ear/sinus pain. Rest/ice/heat/warm bath/Tylenol for musculoskeletal pain.  Pt to keep upcoming appt with [facility] and return to MAU as needed for emergencies.  Pt stable at time of discharge.  ASSESSMENT 1. Musculoskeletal pain   2. Abdominal pain during pregnancy in first trimester   3. Viral URI with cough     PLAN Discharge home Allergies as of 08/17/2016   No Known Allergies     Medication List    TAKE these medications   acetaminophen 650 MG CR tablet Commonly known as:  TYLENOL Take 650 mg by mouth every 8 (eight) hours as needed for pain.   benzonatate 100 MG  capsule Commonly known as:  TESSALON PERLES Take 2 capsules (200 mg total) by mouth 3 (three) times daily as needed for cough.   butalbital-acetaminophen-caffeine 50-325-40 MG tablet Commonly known as:  FIORICET, ESGIC Take 1 tablet by mouth 2 (two) times daily as needed for headache.   prenatal multivitamin Tabs tablet Take 1 tablet by mouth every morning.   progesterone 200 MG Supp Place 200 mg vaginally at bedtime.   ranitidine 150 MG tablet Commonly known as:  ZANTAC Take 1 tablet (150 mg total) by mouth 2 (two) times daily.      Follow-up Information    [facility] Follow up.   Why:  As scheduled, return to MAU as needed for emergencies  Contact information: [address] [place] [id number] [phone]          Sharen CounterLisa Leftwich-Kirby Certified Nurse-Midwife 08/17/2016  10:58 PM

## 2016-08-17 NOTE — MAU Note (Signed)
Pt c/o "burning" in abdomen-started a few days ago, comes and goes. Also having pain that shoots into groin. Denies vag bleeding and discharge. Takes progesterone suppository-hcg was low at first. Denies urinary s/s, heartburn, diarrhea or constipation. Occasional nausea.

## 2016-08-19 ENCOUNTER — Other Ambulatory Visit (HOSPITAL_COMMUNITY)
Admission: RE | Admit: 2016-08-19 | Discharge: 2016-08-19 | Disposition: A | Discharge status: Home / Self Care | Payer: BLUE CROSS/BLUE SHIELD | Source: Ambulatory Visit | Attending: Certified Nurse Midwife | Admitting: Certified Nurse Midwife

## 2016-08-19 ENCOUNTER — Ambulatory Visit (INDEPENDENT_AMBULATORY_CARE_PROVIDER_SITE_OTHER): Payer: Medicaid Other | Admitting: Certified Nurse Midwife

## 2016-08-19 ENCOUNTER — Encounter: Payer: Self-pay | Admitting: Certified Nurse Midwife

## 2016-08-19 VITALS — BP 122/84 | HR 81 | Temp 98.6°F | Wt 228.4 lb

## 2016-08-19 DIAGNOSIS — Z3490 Encounter for supervision of normal pregnancy, unspecified, unspecified trimester: Secondary | ICD-10-CM

## 2016-08-19 DIAGNOSIS — Z113 Encounter for screening for infections with a predominantly sexual mode of transmission: Secondary | ICD-10-CM | POA: Insufficient documentation

## 2016-08-19 DIAGNOSIS — Z01419 Encounter for gynecological examination (general) (routine) without abnormal findings: Secondary | ICD-10-CM | POA: Diagnosis not present

## 2016-08-19 DIAGNOSIS — O34219 Maternal care for unspecified type scar from previous cesarean delivery: Secondary | ICD-10-CM

## 2016-08-19 DIAGNOSIS — O219 Vomiting of pregnancy, unspecified: Secondary | ICD-10-CM

## 2016-08-19 DIAGNOSIS — Z98891 History of uterine scar from previous surgery: Secondary | ICD-10-CM | POA: Insufficient documentation

## 2016-08-19 DIAGNOSIS — O99211 Obesity complicating pregnancy, first trimester: Secondary | ICD-10-CM

## 2016-08-19 MED ORDER — PRENATE PIXIE 10-0.6-0.4-200 MG PO CAPS: 1.0000 | ORAL_CAPSULE | Freq: Every day | ORAL | 12 refills | Status: DC

## 2016-08-19 MED ORDER — DOXYLAMINE-PYRIDOXINE 10-10 MG PO TBEC: DELAYED_RELEASE_TABLET | ORAL | 4 refills | Status: DC

## 2016-08-19 NOTE — Progress Notes (Signed)
Subjective:    Caitlyn Galvan is being seen today for her first obstetrical visit.  This is a planned pregnancy. She is at [redacted]w[redacted]d gestation. Her obstetrical history is significant for previous C-section: LTCS and obesity. Relationship with FOB: spouse, living together. Patient does intend to breast feed. Pregnancy history fully reviewed.  The information documented in the HPI was reviewed and verified.  Menstrual History: OB History    Gravida Para Term Preterm AB Living   3 1 1  0 1 1   SAB TAB Ectopic Multiple Live Births   1 0 0 0         Patient's last menstrual period was 05/23/2016 (approximate).    Past Medical History:  Diagnosis Date  . Anxiety   . Depression   . Headache   . No pertinent past medical history     Past Surgical History:  Procedure Laterality Date  . ADENOIDECTOMY    . adnoids    . CESAREAN SECTION    . DILATION AND CURETTAGE OF UTERUS    . DILATION AND EVACUATION N/A 07/19/2015   Procedure: DILATATION AND EVACUATION;  Surgeon: Mitchel Honour, DO;  Location: [facility];  Service: Gynecology;  Laterality: N/A;  . tubes in ears    . WISDOM TOOTH EXTRACTION       (Not in a hospital admission) No Known Allergies  Social History  Substance Use Topics  . Smoking status: Former Smoker    Packs/day: 0.50    Years: 2.00    Types: Cigarettes, E-cigarettes    Quit date: 06/16/2014  . Smokeless tobacco: Former Neurosurgeon     Comment: 1/2 pack a week  . Alcohol use 0.0 oz/week     Comment: occasionally; not while preg    History reviewed. No pertinent family history.   Review of Systems Constitutional: negative for weight loss Gastrointestinal: negative for vomiting Genitourinary:negative for genital lesions and vaginal discharge and dysuria Musculoskeletal:negative for back pain Behavioral/Psych: negative for abusive relationship, depression, illegal drug usage and tobacco use    Objective:    BP 122/84   Pulse 81   Temp 98.6 F (37 C)   Wt 228  lb 6.4 oz (103.6 kg)   LMP 05/23/2016 (Approximate)   BMI 40.46 kg/m  General Appearance:    Alert, cooperative, no distress, appears stated age  Head:    Normocephalic, without obvious abnormality, atraumatic  Eyes:    PERRL, conjunctiva/corneas clear, EOM's intact, fundi    benign, both eyes  Ears:    Normal TM's and external ear canals, both ears  Nose:   Nares normal, septum midline, mucosa normal, no drainage    or sinus tenderness  Throat:   Lips, mucosa, and tongue normal; teeth and gums normal  Neck:   Supple, symmetrical, trachea midline, no adenopathy;    thyroid:  no enlargement/tenderness/nodules; no carotid   bruit or JVD  Back:     Symmetric, no curvature, ROM normal, no CVA tenderness  Lungs:     Clear to auscultation bilaterally, respirations unlabored  Chest Wall:    No tenderness or deformity   Heart:    Regular rate and rhythm, S1 and S2 normal, no murmur, rub   or gallop  Breast Exam:    No tenderness, masses, or nipple abnormality  Abdomen:     Soft, non-tender, bowel sounds active all four quadrants,    no masses, no organomegaly  Genitalia:    Normal female without lesion, discharge or tenderness  Extremities:  Extremities normal, atraumatic, no cyanosis or edema  Pulses:   2+ and symmetric all extremities  Skin:   Skin color, texture, turgor normal, no rashes or lesions  Lymph nodes:   Cervical, supraclavicular, and axillary nodes normal  Neurologic:   CNII-XII intact, normal strength, sensation and reflexes    throughout       Cervix:   Long, thick, closed and posterior.  FHR: 164  By doppler.   FH: about 12                         week size.     Lab Review Urine pregnancy test Labs reviewed yes Radiologic studies reviewed no Assessment:    Pregnancy at [redacted]w[redacted]d weeks   Obesity  H/O C-section  Plan:     Desires TOLAC/VBAC, previous C-section for failure to progress.  Prenatal vitamins.  Counseling provided regarding continued use of seat belts,  cessation of alcohol consumption, smoking or use of illicit drugs; infection precautions i.e., influenza/TDAP immunizations, toxoplasmosis,CMV, parvovirus, listeria and varicella; workplace safety, exercise during pregnancy; routine dental care, safe medications, sexual activity, hot tubs, saunas, pools, travel, caffeine use, fish and methlymercury, potential toxins, hair treatments, varicose veins Weight gain recommendations per IOM guidelines reviewed: underweight/BMI< 18.5--> gain 28 - 40 lbs; normal weight/BMI 18.5 - 24.9--> gain 25 - 35 lbs; overweight/BMI 25 - 29.9--> gain 15 - 25 lbs; obese/BMI >30->gain  11 - 20 lbs Problem list reviewed and updated. FIRST/CF mutation testing/NIPT/QUAD SCREEN/fragile X/Ashkenazi Jewish population testing/Spinal muscular atrophy discussed: ordered. Role of ultrasound in pregnancy discussed; fetal survey: requested. Amniocentesis discussed: not indicated. VBAC calculator score: VBAC consent form provided Meds ordered this encounter  Medications  . Doxylamine-Pyridoxine (DICLEGIS) 10-10 MG TBEC    Sig: Take 1 tablet with breakfast and lunch.  Take 2 tablets at bedtime.    Dispense:  100 tablet    Refill:  4  . Prenat-FeAsp-Meth-FA-DHA w/o A (PRENATE PIXIE) 10-0.6-0.4-200 MG CAPS    Sig: Take 1 tablet by mouth daily.    Dispense:  30 capsule    Refill:  12    Please process coupon: Rx BIN: [id number], RxPCN: [id number], RxGRP: [id number], RxID: [id number]  SUF: 01   Orders Placed This Encounter  Procedures  . Culture, OB Urine  . TSH  . Hemoglobinopathy evaluation  . Varicella zoster antibody, IgG  . MaterniT21 PLUS Core+SCA    Order Specific Question:   Is the patient insulin dependent?    Answer:   No    Order Specific Question:   Please enter gestational age. This should be expressed as weeks AND days, i.e. 16w 6d. Enter weeks here. Enter days in next question.    Answer:   62    Order Specific Question:   Please enter gestational age. This should be  expressed as weeks AND days, i.e. 16w 6d. Enter days here. Enter weeks in previous question.    Answer:   4    Order Specific Question:   How was gestational age calculated?    Answer:   LMP    Order Specific Question:   Please give the date of LMP OR Ultrasound OR Estimated date of delivery.    Answer:   02/27/2017    Order Specific Question:   Number of Fetuses (Type of Pregnancy):    Answer:   1    Order Specific Question:   Indications for performing the test? (please choose all that apply):  Answer:   Routine screening    Order Specific Question:   Other Indications? (Y=Yes, N=No)    Answer:   N    Order Specific Question:   If this is a repeat specimen, please indicate the reason:    Answer:   Not indicated    Order Specific Question:   Please specify the patient's race: (C=White/Caucasion, B=Black, I=Native American, A=Asian, H=Hispanic, O=Other, U=Unknown)    Answer:   C    Order Specific Question:   Donor Egg - indicate if the egg was obtained from in vitro fertilization.    Answer:   N    Order Specific Question:   Age of Egg Donor.    Answer:   [age]    Order Specific Question:   Prior Down Syndrome/ONTD screening during current pregnancy.    Answer:   N    Order Specific Question:   Prior First Trimester Testing    Answer:   N    Order Specific Question:   Prior Second Trimester Testing    Answer:   N    Order Specific Question:   Family History of Neural Tube Defects    Answer:   N    Order Specific Question:   Prior Pregnancy with Down Syndrome    Answer:   N    Order Specific Question:   Please give the patient's weight (in pounds)    Answer:   228  . ToxASSURE Select 13 (MW), Urine  . Hemoglobin A1c  . Obstetric Panel, Including HIV  . Cystic Fibrosis Mutation 97    Follow up in 4 weeks. 50% of 30 min visit spent on counseling and coordination of care.

## 2016-08-19 NOTE — Progress Notes (Signed)
Babyscripts  

## 2016-08-21 LAB — CERVICOVAGINAL ANCILLARY ONLY
Bacterial vaginitis: NEGATIVE
CANDIDA VAGINITIS: NEGATIVE
CHLAMYDIA, DNA PROBE: NEGATIVE
Neisseria Gonorrhea: NEGATIVE
Trichomonas: NEGATIVE

## 2016-08-22 LAB — CYTOLOGY - PAP: Diagnosis: NEGATIVE

## 2016-08-23 LAB — CULTURE, OB URINE

## 2016-08-23 LAB — URINE CULTURE, OB REFLEX

## 2016-08-26 LAB — CYSTIC FIBROSIS MUTATION 97: GENE DIS ANAL CARRIER INTERP BLD/T-IMP: NOT DETECTED

## 2016-08-26 LAB — OBSTETRIC PANEL, INCLUDING HIV
ANTIBODY SCREEN: NEGATIVE
BASOS ABS: 0 10*3/uL (ref 0.0–0.2)
BASOS: 0 %
EOS (ABSOLUTE): 0.1 10*3/uL (ref 0.0–0.4)
Eos: 1 %
HIV SCREEN 4TH GENERATION: NONREACTIVE
Hematocrit: 37.1 % (ref 34.0–46.6)
Hemoglobin: 12.6 g/dL (ref 11.1–15.9)
Hepatitis B Surface Ag: NEGATIVE
Immature Grans (Abs): 0 10*3/uL (ref 0.0–0.1)
Immature Granulocytes: 1 %
LYMPHS ABS: 2.1 10*3/uL (ref 0.7–3.1)
Lymphs: 25 %
MCH: 28.9 pg (ref 26.6–33.0)
MCHC: 34 g/dL (ref 31.5–35.7)
MCV: 85 fL (ref 79–97)
MONOCYTES: 7 %
MONOS ABS: 0.6 10*3/uL (ref 0.1–0.9)
NEUTROS ABS: 5.7 10*3/uL (ref 1.4–7.0)
Neutrophils: 66 %
PLATELETS: 284 10*3/uL (ref 150–379)
RBC: 4.36 x10E6/uL (ref 3.77–5.28)
RDW: 14.2 % (ref 12.3–15.4)
RPR Ser Ql: NONREACTIVE
RUBELLA: 1.07 {index} (ref >0.99)
Rh Factor: POSITIVE
WBC: 8.5 10*3/uL (ref 3.4–10.8)

## 2016-08-26 LAB — HEMOGLOBIN A1C
ESTIMATED AVERAGE GLUCOSE: 88 mg/dL
Hgb A1c MFr Bld: 4.7 % — ABNORMAL LOW (ref 4.8–5.6)

## 2016-08-26 LAB — HEMOGLOBINOPATHY EVALUATION
HEMOGLOBIN A2 QUANTITATION: 2.4 % (ref 1.8–3.2)
HGB C: 0 %
HGB S: 0 %
HGB VARIANT: 0 %
Hemoglobin F Quantitation: 0 % (ref 0.0–2.0)
Hgb A: 97.6 % (ref 96.4–98.8)

## 2016-08-26 LAB — TOXASSURE SELECT 13 (MW), URINE

## 2016-08-26 LAB — TSH: TSH: 1.07 u[IU]/mL (ref 0.450–4.500)

## 2016-09-09 ENCOUNTER — Encounter: Payer: Self-pay | Admitting: Certified Nurse Midwife

## 2016-09-16 ENCOUNTER — Ambulatory Visit (INDEPENDENT_AMBULATORY_CARE_PROVIDER_SITE_OTHER): Payer: BLUE CROSS/BLUE SHIELD | Admitting: Certified Nurse Midwife

## 2016-09-16 VITALS — BP 111/75 | HR 76 | Wt 233.0 lb

## 2016-09-16 DIAGNOSIS — O34219 Maternal care for unspecified type scar from previous cesarean delivery: Secondary | ICD-10-CM

## 2016-09-16 DIAGNOSIS — Z3490 Encounter for supervision of normal pregnancy, unspecified, unspecified trimester: Secondary | ICD-10-CM

## 2016-09-16 DIAGNOSIS — Z3492 Encounter for supervision of normal pregnancy, unspecified, second trimester: Secondary | ICD-10-CM

## 2016-09-16 DIAGNOSIS — O99212 Obesity complicating pregnancy, second trimester: Secondary | ICD-10-CM

## 2016-09-16 DIAGNOSIS — Z98891 History of uterine scar from previous surgery: Secondary | ICD-10-CM

## 2016-09-16 NOTE — Progress Notes (Signed)
   PRENATAL VISIT NOTE  Subjective:  Caitlyn Galvan is a 26 y.o. G3P1011 at 4829w4d being seen today for ongoing prenatal care.  She is currently monitored for the following issues for this low-risk pregnancy and has Severe obesity (BMI >= 40) (HCC); Supervision of normal pregnancy, antepartum; and H/O: cesarean section on her problem list.  Patient reports backache, no bleeding, no contractions, no cramping, no leaking and occasional pelvic pressure, stable.  Contractions: Not present. Vag. Bleeding: None.  Movement: Absent. Denies leaking of fluid.   The following portions of the patient's history were reviewed and updated as appropriate: allergies, current medications, past family history, past medical history, past social history, past surgical history and problem list. Problem list updated.  Objective:   Vitals:   09/16/16 1029  BP: 111/75  Pulse: 76  Weight: 233 lb (105.7 kg)    Fetal Status: Fetal Heart Rate (bpm): 158   Movement: Absent     General:  Alert, oriented and cooperative. Patient is in no acute distress.  Skin: Skin is warm and dry. No rash noted.   Cardiovascular: Normal heart rate noted  Respiratory: Normal respiratory effort, no problems with respiration noted  Abdomen: Soft, gravid, appropriate for gestational age. Pain/Pressure: Present     Pelvic:  Cervical exam deferred        Extremities: Normal range of motion.     Mental Status: Normal mood and affect. Normal behavior. Normal judgment and thought content.   Assessment and Plan:  Pregnancy: G3P1011 at 4929w4d  1. Encounter for supervision of normal pregnancy, antepartum, unspecified gravidity     Was placed in vaginal progesterone suppositories at start of pregnancy for hx of low progesterone at another office.  Educated to stop progesterone and would check levels to make sure they are normal.  Denies any vaginal bleeding or cramping.  - US MFM OB COMP + 14 WK; Future - MaterniT21 PLUS Core+SCA -  AFP, Serum, Open Spina Bifida - 17-Hydroxyprogesterone - Progesterone  2. Severe obesity (BMI >= 40) (HCC)  - US MFM OB COMP + 14 WK; Future  3. H/O: cesarean section     TOLAC planned - US MFM OB COMP + 14 WK; Future  Preterm labor symptoms and general obstetric precautions including but not limited to vaginal bleeding, contractions, leaking of fluid and fetal movement were reviewed in detail with the patient. Please refer to After Visit Summary for other counseling recommendations.  Return in about 4 weeks (around 10/14/2016) for ROB.   Roe Coombsachelle A Tayton Decaire, CNM

## 2016-09-16 NOTE — Progress Notes (Signed)
Pt is having some pelvic pressure.   

## 2016-09-17 ENCOUNTER — Encounter: Payer: Self-pay | Admitting: Certified Nurse Midwife

## 2016-09-18 ENCOUNTER — Telehealth: Payer: Self-pay

## 2016-09-18 NOTE — Telephone Encounter (Signed)
Contacted patient to follow up on N&V that she previously stated, patient stated that she is better now and no longer is experiencing the symptoms.

## 2016-09-23 ENCOUNTER — Other Ambulatory Visit: Payer: Self-pay | Admitting: Certified Nurse Midwife

## 2016-09-23 DIAGNOSIS — Z3490 Encounter for supervision of normal pregnancy, unspecified, unspecified trimester: Secondary | ICD-10-CM

## 2016-09-23 DIAGNOSIS — Z98891 History of uterine scar from previous surgery: Secondary | ICD-10-CM

## 2016-09-23 LAB — MATERNIT21 PLUS CORE+SCA
CHROMOSOME 18: NEGATIVE
Chromosome 13: NEGATIVE
Chromosome 21: NEGATIVE
Y Chromosome: NOT DETECTED

## 2016-09-24 LAB — AFP, SERUM, OPEN SPINA BIFIDA
AFP MoM: 0.61
AFP Value: 16.6 ng/mL
GEST. AGE ON COLLECTION DATE: 16.6 wk
Maternal Age At EDD: 25.2 years
OSBR RISK 1 IN: 10000
TEST RESULTS AFP: NEGATIVE
Weight: 233 [lb_av]

## 2016-09-24 LAB — 17-HYDROXYPROGESTERONE: 17 HYDROXYPROGESTERONE: 162 ng/dL

## 2016-09-24 LAB — PROGESTERONE: PROGESTERONE: 19.2 ng/mL

## 2016-09-25 ENCOUNTER — Encounter: Payer: Self-pay | Admitting: Certified Nurse Midwife

## 2016-09-25 ENCOUNTER — Other Ambulatory Visit: Payer: Self-pay | Admitting: Certified Nurse Midwife

## 2016-09-25 DIAGNOSIS — Z3490 Encounter for supervision of normal pregnancy, unspecified, unspecified trimester: Secondary | ICD-10-CM

## 2016-09-30 ENCOUNTER — Other Ambulatory Visit: Payer: Self-pay | Admitting: Certified Nurse Midwife

## 2016-09-30 ENCOUNTER — Ambulatory Visit (HOSPITAL_COMMUNITY)
Admission: RE | Admit: 2016-09-30 | Discharge: 2016-09-30 | Disposition: A | Discharge status: Home / Self Care | Payer: BLUE CROSS/BLUE SHIELD | Source: Ambulatory Visit | Attending: Certified Nurse Midwife | Admitting: Certified Nurse Midwife

## 2016-09-30 DIAGNOSIS — O34211 Maternal care for low transverse scar from previous cesarean delivery: Secondary | ICD-10-CM | POA: Insufficient documentation

## 2016-09-30 DIAGNOSIS — O99212 Obesity complicating pregnancy, second trimester: Secondary | ICD-10-CM

## 2016-09-30 DIAGNOSIS — Z3A18 18 weeks gestation of pregnancy: Secondary | ICD-10-CM

## 2016-09-30 DIAGNOSIS — Z3490 Encounter for supervision of normal pregnancy, unspecified, unspecified trimester: Secondary | ICD-10-CM

## 2016-09-30 DIAGNOSIS — Z3689 Encounter for other specified antenatal screening: Secondary | ICD-10-CM

## 2016-09-30 DIAGNOSIS — Z98891 History of uterine scar from previous surgery: Secondary | ICD-10-CM

## 2016-10-02 ENCOUNTER — Other Ambulatory Visit: Payer: Self-pay | Admitting: Certified Nurse Midwife

## 2016-10-02 DIAGNOSIS — Z3480 Encounter for supervision of other normal pregnancy, unspecified trimester: Secondary | ICD-10-CM

## 2016-10-03 ENCOUNTER — Encounter (HOSPITAL_COMMUNITY): Payer: Self-pay | Admitting: Emergency Medicine

## 2016-10-03 ENCOUNTER — Emergency Department (HOSPITAL_COMMUNITY)
Admission: EM | Admit: 2016-10-03 | Discharge: 2016-10-03 | Disposition: A | Discharge status: Home / Self Care | Payer: BLUE CROSS/BLUE SHIELD | Attending: Emergency Medicine | Admitting: Emergency Medicine

## 2016-10-03 DIAGNOSIS — Z87891 Personal history of nicotine dependence: Secondary | ICD-10-CM | POA: Diagnosis not present

## 2016-10-03 DIAGNOSIS — W57XXXA Bitten or stung by nonvenomous insect and other nonvenomous arthropods, initial encounter: Secondary | ICD-10-CM | POA: Insufficient documentation

## 2016-10-03 DIAGNOSIS — S70362A Insect bite (nonvenomous), left thigh, initial encounter: Secondary | ICD-10-CM | POA: Diagnosis present

## 2016-10-03 DIAGNOSIS — Y939 Activity, unspecified: Secondary | ICD-10-CM | POA: Insufficient documentation

## 2016-10-03 DIAGNOSIS — Y929 Unspecified place or not applicable: Secondary | ICD-10-CM | POA: Insufficient documentation

## 2016-10-03 DIAGNOSIS — Z79899 Other long term (current) drug therapy: Secondary | ICD-10-CM | POA: Diagnosis not present

## 2016-10-03 DIAGNOSIS — Y999 Unspecified external cause status: Secondary | ICD-10-CM | POA: Insufficient documentation

## 2016-10-03 LAB — COMPREHENSIVE METABOLIC PANEL
ALBUMIN: 3.4 g/dL — AB (ref 3.5–5.0)
ALT: 21 U/L (ref 14–54)
AST: 18 U/L (ref 15–41)
Alkaline Phosphatase: 41 U/L (ref 38–126)
Anion gap: 6 (ref 5–15)
BUN: 9 mg/dL (ref 6–20)
CHLORIDE: 107 mmol/L (ref 101–111)
CO2: 25 mmol/L (ref 22–32)
Calcium: 8.9 mg/dL (ref 8.9–10.3)
Creatinine, Ser: 0.5 mg/dL (ref 0.44–1.00)
GFR calc Af Amer: >60 mL/min (ref >60)
GFR calc non Af Amer: >60 mL/min (ref >60)
Glucose, Bld: 96 mg/dL (ref 65–99)
POTASSIUM: 3.5 mmol/L (ref 3.5–5.1)
Sodium: 138 mmol/L (ref 135–145)
Total Bilirubin: 0.3 mg/dL (ref 0.3–1.2)
Total Protein: 6.8 g/dL (ref 6.5–8.1)

## 2016-10-03 LAB — CBC WITH DIFFERENTIAL/PLATELET
Basophils Absolute: 0 10*3/uL (ref 0.0–0.1)
Basophils Relative: 0 %
EOS PCT: 1 %
Eosinophils Absolute: 0.1 10*3/uL (ref 0.0–0.7)
HCT: 35.1 % — ABNORMAL LOW (ref 36.0–46.0)
Hemoglobin: 12 g/dL (ref 12.0–15.0)
LYMPHS ABS: 2.4 10*3/uL (ref 0.7–4.0)
LYMPHS PCT: 18 %
MCH: 29.5 pg (ref 26.0–34.0)
MCHC: 34.2 g/dL (ref 30.0–36.0)
MCV: 86.2 fL (ref 78.0–100.0)
MONO ABS: 0.8 10*3/uL (ref 0.1–1.0)
MONOS PCT: 6 %
Neutro Abs: 9.8 10*3/uL — ABNORMAL HIGH (ref 1.7–7.7)
Neutrophils Relative %: 75 %
PLATELETS: 291 10*3/uL (ref 150–400)
RBC: 4.07 MIL/uL (ref 3.87–5.11)
RDW: 13.6 % (ref 11.5–15.5)
WBC: 13 10*3/uL — ABNORMAL HIGH (ref 4.0–10.5)

## 2016-10-03 NOTE — ED Notes (Signed)
Pt stated the abscess has grown in size since appearance on Tuesday 2/27.

## 2016-10-03 NOTE — ED Provider Notes (Signed)
[facility]-EMERGENCY DEPT Provider Note   CSN: [id number] Arrival date & time: 10/03/16  1821 By signing my name below, I, Levon HedgerElizabeth Hall, attest that this documentation has been prepared under the direction and in the presence of non-physician practitioner, Melvenia BeamShari A Daichi Moris PA-C. Electronically Signed: Levon HedgerElizabeth Hall, Scribe. 10/03/2016. 10:29 PM.   History   Chief Complaint Chief Complaint  Patient presents with  . Leg Pain    insect bite or abscess   HPI Comments: Caitlyn Galvan is a [redacted] weeks pregnant 26 y.o. female who presents to the Emergency Department complaining of a moderate, gradually worsening area of pain and swelling to the right upper thigh onset three days ago. Pt states pain is exacerbated with palpation and direct pressure. No treatments tried PTA. She denies fever, chills, or drainage from the area. Pt has no other complaints or symptoms at this time. Her next OB appointment is 10/16/15.   The history is provided by the patient. No language interpreter was used.   Past Medical History:  Diagnosis Date  . Anxiety   . Depression   . Headache   . No pertinent past medical history     Patient Active Problem List   Diagnosis Date Noted  . Supervision of normal pregnancy, antepartum 08/19/2016  . H/O: cesarean section 08/19/2016  . Severe obesity (BMI >= 40) (HCC) 10/19/2014    Past Surgical History:  Procedure Laterality Date  . ADENOIDECTOMY    . adnoids    . CESAREAN SECTION    . DILATION AND CURETTAGE OF UTERUS    . DILATION AND EVACUATION N/A 07/19/2015   Procedure: DILATATION AND EVACUATION;  Surgeon: Mitchel HonourMegan Morris, DO;  Location: [facility];  Service: Gynecology;  Laterality: N/A;  . tubes in ears    . WISDOM TOOTH EXTRACTION      OB History    Gravida Para Term Preterm AB Living   3 1 1  0 1 1   SAB TAB Ectopic Multiple Live Births   1 0 0 0        Home Medications    Prior to Admission medications   Medication Sig Start Date End Date Taking?  Authorizing Provider  acetaminophen (TYLENOL) 650 MG CR tablet Take 650 mg by mouth every 8 (eight) hours as needed for pain.   Yes Historical Provider, MD  Butalbital-APAP-Caffeine 50-300-40 MG CAPS Take 1 capsule by mouth 2 (two) times daily as needed (for headache).   Yes Historical Provider, MD  Doxylamine-Pyridoxine (DICLEGIS) 10-10 MG TBEC Take 1 tablet with breakfast and lunch.  Take 2 tablets at bedtime. Patient taking differently: Take 1-2 tablets by mouth 3 (three) times daily as needed (for nausea).  08/19/16  Yes Rachelle A Denney, CNM  Prenat-FeAsp-Meth-FA-DHA w/o A (PRENATE PIXIE) 10-0.6-0.4-200 MG CAPS Take 1 capsule by mouth daily.   Yes Historical Provider, MD  progesterone (PROMETRIUM) 200 MG capsule Place 200 mg vaginally at bedtime.   Yes Historical Provider, MD  ranitidine (ZANTAC) 150 MG tablet Take 1 tablet (150 mg total) by mouth 2 (two) times daily. Patient taking differently: Take 150 mg by mouth daily.  08/17/16  Yes Hurshel PartyLisa A Leftwich-Kirby, CNM    Family History No family history on file.  Social History Social History  Substance Use Topics  . Smoking status: Former Smoker    Packs/day: 0.50    Years: 2.00    Types: Cigarettes, E-cigarettes    Quit date: 06/16/2014  . Smokeless tobacco: Former NeurosurgeonUser     Comment: 1/2  pack a week  . Alcohol use 0.0 oz/week     Comment: occasionally; not while preg    Allergies   Patient has no known allergies.  Review of Systems Review of Systems  Constitutional: Negative for chills.  Gastrointestinal: Negative for nausea.  Musculoskeletal: Negative for myalgias.  Skin: Positive for color change.   Physical Exam Updated Vital Signs BP 131/73 (BP Location: Left Arm)   Pulse 91   Temp 98.7 F (37.1 C) (Oral)   Resp 14   Ht 5\' 3"  (1.6 m)   Wt 205 lb (93 kg)   LMP 05/23/2016 (Approximate)   SpO2 100%   BMI 36.31 kg/m   Physical Exam  Constitutional: She is oriented to person, place, and time. She appears  well-developed and well-nourished. No distress.  HENT:  Head: Normocephalic and atraumatic.  Eyes: Conjunctivae are normal.  Cardiovascular: Normal rate.   Pulmonary/Chest: Effort normal.  Abdominal: She exhibits no distension.  Neurological: She is alert and oriented to person, place, and time.  Skin: Skin is warm and dry.  Left lateral mid-thigh there is a 4x3 cm area of erythema without induration. There is a darkened central area without ulceration or blistering.   Psychiatric: She has a normal mood and affect.  Nursing note and vitals reviewed.  ED Treatments / Results  DIAGNOSTIC STUDIES:  Oxygen Saturation is 100% on RA, normal by my interpretation.    COORDINATION OF CARE:  10:27 PM Discussed treatment plan with pt at bedside and pt agreed to plan.   Labs (all labs ordered are listed, but only abnormal results are displayed) Labs Reviewed  COMPREHENSIVE METABOLIC PANEL - Abnormal; Notable for the following:       Result Value   Albumin 3.4 (*)    All other components within normal limits  CBC WITH DIFFERENTIAL/PLATELET - Abnormal; Notable for the following:    WBC 13.0 (*)    HCT 35.1 (*)    Neutro Abs 9.8 (*)    All other components within normal limits    EKG  EKG Interpretation None       Radiology No results found.  Procedures Procedures (including critical care time)  Medications Ordered in ED Medications - No data to display   Initial Impression / Assessment and Plan / ED Course  I have reviewed the triage vital signs and the nursing notes.  Pertinent labs & imaging results that were available during my care of the patient were reviewed by me and considered in my medical decision making (see chart for details).     Patient presents with tender, erythematous area to left lateral thigh x 3 days. No fever or drainage. The area is c/w insect, possibly spider, bite with erythema felt to be inflammatory as opposed to infectious. Recommended  supportive care and Tylenol. Return precautions discussed.  Final Clinical Impressions(s) / ED Diagnoses   Final diagnoses:  None   1. Insect bite  New Prescriptions New Prescriptions   No medications on file  I personally performed the services described in this documentation, which was scribed in my presence. The recorded information has been reviewed and is accurate.      Elpidio Anis, PA-C 10/03/16 2243    Doug Sou, MD 10/04/16 [phone]

## 2016-10-03 NOTE — ED Notes (Signed)
Pt has an area of redness consistent with an insect bite.

## 2016-10-03 NOTE — ED Triage Notes (Addendum)
Pt complaint of right upper thigh pain related to possible abscess or insect bite; hx of abscess but this one is red, swollen, and painful; unknown cause. Pt is [redacted] weeks pregnant; denies pregnancy complaint.

## 2016-10-03 NOTE — Discharge Instructions (Signed)
Apply warm compresses to the area for comfort. Continue Tylenol as needed for pain. Return to the emergency department with any worsening symptoms or new concern.

## 2016-10-14 ENCOUNTER — Ambulatory Visit (INDEPENDENT_AMBULATORY_CARE_PROVIDER_SITE_OTHER): Payer: BLUE CROSS/BLUE SHIELD | Admitting: Certified Nurse Midwife

## 2016-10-14 ENCOUNTER — Encounter: Payer: Self-pay | Admitting: Certified Nurse Midwife

## 2016-10-14 VITALS — BP 126/81 | HR 92 | Wt 236.2 lb

## 2016-10-14 DIAGNOSIS — Z3482 Encounter for supervision of other normal pregnancy, second trimester: Secondary | ICD-10-CM

## 2016-10-14 DIAGNOSIS — O34219 Maternal care for unspecified type scar from previous cesarean delivery: Secondary | ICD-10-CM

## 2016-10-14 DIAGNOSIS — Z3480 Encounter for supervision of other normal pregnancy, unspecified trimester: Secondary | ICD-10-CM

## 2016-10-14 DIAGNOSIS — Z98891 History of uterine scar from previous surgery: Secondary | ICD-10-CM

## 2016-10-14 NOTE — Progress Notes (Signed)
Patient is in the office and reports good fetal movement 

## 2016-10-14 NOTE — Progress Notes (Signed)
   PRENATAL VISIT NOTE  Subjective:  Caitlyn Galvan is a 26 y.o. G3P1011 at 5745w4d being seen today for ongoing prenatal care.  She is currently monitored for the following issues for this low-risk pregnancy and has Severe obesity (BMI >= 40) (HCC); Supervision of normal pregnancy, antepartum; and H/O: cesarean section on her problem list.  Patient reports no complaints.  Contractions: Not present. Vag. Bleeding: None.  Movement: Present. Denies leaking of fluid.   The following portions of the patient's history were reviewed and updated as appropriate: allergies, current medications, past family history, past medical history, past social history, past surgical history and problem list. Problem list updated.  Objective:   Vitals:   10/14/16 1103  BP: 126/81  Pulse: 92  Weight: 236 lb 3.2 oz (107.1 kg)    Fetal Status: Fetal Heart Rate (bpm): 154 Fundal Height: 21 cm Movement: Present     General:  Alert, oriented and cooperative. Patient is in no acute distress.  Skin: Skin is warm and dry. No rash noted.   Cardiovascular: Normal heart rate noted  Respiratory: Normal respiratory effort, no problems with respiration noted  Abdomen: Soft, gravid, appropriate for gestational age. Pain/Pressure: Absent     Pelvic:  Cervical exam deferred        Extremities: Normal range of motion.  Edema: None  Mental Status: Normal mood and affect. Normal behavior. Normal judgment and thought content.   Assessment and Plan:  Pregnancy: G3P1011 at 7445w4d  1. Supervision of other normal pregnancy, antepartum     Doing well  2. H/O: cesarean section     TOLAC form completed today.   Preterm labor symptoms and general obstetric precautions including but not limited to vaginal bleeding, contractions, leaking of fluid and fetal movement were reviewed in detail with the patient. Please refer to After Visit Summary for other counseling recommendations.  Return in about 4 weeks (around 11/11/2016)  for ROB.   Roe Coombsachelle A Denney, CNM

## 2016-10-21 ENCOUNTER — Encounter: Payer: Self-pay | Admitting: *Deleted

## 2016-10-30 ENCOUNTER — Other Ambulatory Visit: Payer: Self-pay | Admitting: Certified Nurse Midwife

## 2016-10-30 ENCOUNTER — Encounter: Payer: Self-pay | Admitting: Certified Nurse Midwife

## 2016-10-30 DIAGNOSIS — O99619 Diseases of the digestive system complicating pregnancy, unspecified trimester: Principal | ICD-10-CM

## 2016-10-30 DIAGNOSIS — K219 Gastro-esophageal reflux disease without esophagitis: Secondary | ICD-10-CM

## 2016-10-30 MED ORDER — RANITIDINE HCL 150 MG PO TABS: 150.0000 mg | ORAL_TABLET | Freq: Two times a day (BID) | ORAL | 5 refills | Status: DC

## 2016-11-11 ENCOUNTER — Ambulatory Visit (INDEPENDENT_AMBULATORY_CARE_PROVIDER_SITE_OTHER): Payer: BLUE CROSS/BLUE SHIELD | Admitting: Certified Nurse Midwife

## 2016-11-11 DIAGNOSIS — Z3482 Encounter for supervision of other normal pregnancy, second trimester: Secondary | ICD-10-CM

## 2016-11-11 DIAGNOSIS — O9989 Other specified diseases and conditions complicating pregnancy, childbirth and the puerperium: Secondary | ICD-10-CM

## 2016-11-11 DIAGNOSIS — Z3480 Encounter for supervision of other normal pregnancy, unspecified trimester: Secondary | ICD-10-CM

## 2016-11-11 DIAGNOSIS — Z98891 History of uterine scar from previous surgery: Secondary | ICD-10-CM

## 2016-11-11 DIAGNOSIS — O99212 Obesity complicating pregnancy, second trimester: Secondary | ICD-10-CM

## 2016-11-11 DIAGNOSIS — N949 Unspecified condition associated with female genital organs and menstrual cycle: Secondary | ICD-10-CM

## 2016-11-11 DIAGNOSIS — O34219 Maternal care for unspecified type scar from previous cesarean delivery: Secondary | ICD-10-CM

## 2016-11-11 MED ORDER — COMFORT FIT MATERNITY SUPP LG MISC: 1.0000 [IU] | Freq: Every day | 0 refills | Status: DC

## 2016-11-11 NOTE — Progress Notes (Signed)
Patient has R pain during IC and next day if she coughs or laughs. She feels it is at the scar tissue.

## 2016-11-12 NOTE — Progress Notes (Signed)
   PRENATAL VISIT NOTE  Subjective:  Caitlyn Galvan is a 26 y.o. G3P1011 at [redacted]w[redacted]d being seen today for ongoing prenatal care.  She is currently monitored for the following issues for this low-risk pregnancy and has Severe obesity (BMI >= 40) (HCC); Supervision of normal pregnancy, antepartum; H/O: cesarean section; and Round ligament pain on her problem list.  Patient reports no bleeding, no contractions, no cramping, no leaking and round ligament type pain with movement, sharp shooting pain in lower abdomen.  Contractions: Not present. Vag. Bleeding: None.  Movement: Present. Denies leaking of fluid.   The following portions of the patient's history were reviewed and updated as appropriate: allergies, current medications, past family history, past medical history, past social history, past surgical history and problem list. Problem list updated.  Objective:   Vitals:   11/11/16 0835  BP: 134/85  Pulse: (!) 103  Weight: 245 lb 3.2 oz (111.2 kg)    Fetal Status: Fetal Heart Rate (bpm): 155 Fundal Height: 25 cm Movement: Present     General:  Alert, oriented and cooperative. Patient is in no acute distress.  Skin: Skin is warm and dry. No rash noted.   Cardiovascular: Normal heart rate noted  Respiratory: Normal respiratory effort, no problems with respiration noted  Abdomen: Soft, gravid, appropriate for gestational age. Pain/Pressure: Present     Pelvic:  Cervical exam deferred        Extremities: Normal range of motion.  Edema: None  Mental Status: Normal mood and affect. Normal behavior. Normal judgment and thought content.   Assessment and Plan:  Pregnancy: G3P1011 at [redacted]w[redacted]d  1. Severe obesity (BMI >= 40) (HCC)       - Elastic Bandages & Supports (COMFORT FIT MATERNITY SUPP LG) MISC; 1 Units by Does not apply route daily.  Dispense: 1 each; Refill: 0  2. Supervision of other normal pregnancy, antepartum         3. H/O: cesarean section     TOLAC planned signed  10/15/16  4. Round ligament pain     Rx written. Hemopathic remedies discussed. - Elastic Bandages & Supports (COMFORT FIT MATERNITY SUPP LG) MISC; 1 Units by Does not apply route daily.  Dispense: 1 each; Refill: 0  Preterm labor symptoms and general obstetric precautions including but not limited to vaginal bleeding, contractions, leaking of fluid and fetal movement were reviewed in detail with the patient. Please refer to After Visit Summary for other counseling recommendations.  Return in about 4 weeks (around 12/09/2016) for ROB, 2 hr OGTT, babyscripts.   Roe Coombs, CNM

## 2016-11-19 ENCOUNTER — Encounter: Payer: Self-pay | Admitting: Certified Nurse Midwife

## 2016-11-19 ENCOUNTER — Encounter: Payer: Self-pay | Admitting: *Deleted

## 2016-12-09 ENCOUNTER — Encounter: Payer: Self-pay | Admitting: Certified Nurse Midwife

## 2016-12-09 ENCOUNTER — Other Ambulatory Visit: Payer: BLUE CROSS/BLUE SHIELD

## 2016-12-09 ENCOUNTER — Ambulatory Visit (INDEPENDENT_AMBULATORY_CARE_PROVIDER_SITE_OTHER): Payer: BLUE CROSS/BLUE SHIELD | Admitting: Certified Nurse Midwife

## 2016-12-09 VITALS — BP 120/89 | HR 108 | Wt 255.3 lb

## 2016-12-09 DIAGNOSIS — Z3480 Encounter for supervision of other normal pregnancy, unspecified trimester: Secondary | ICD-10-CM

## 2016-12-09 DIAGNOSIS — N949 Unspecified condition associated with female genital organs and menstrual cycle: Secondary | ICD-10-CM

## 2016-12-09 DIAGNOSIS — Z23 Encounter for immunization: Secondary | ICD-10-CM

## 2016-12-09 DIAGNOSIS — Z98891 History of uterine scar from previous surgery: Secondary | ICD-10-CM

## 2016-12-09 DIAGNOSIS — Z3483 Encounter for supervision of other normal pregnancy, third trimester: Secondary | ICD-10-CM

## 2016-12-09 NOTE — Progress Notes (Signed)
Patient reports good fetal movement, complains of pain on left side of groin area.

## 2016-12-09 NOTE — Progress Notes (Signed)
   PRENATAL VISIT NOTE  Subjective:  Caitlyn Galvan is a 26 y.o. G3P1011 at 4816w4d being seen today for ongoing prenatal care.  She is currently monitored for the following issues for this low-risk pregnancy and has Severe obesity (BMI >= 40) (HCC); Supervision of normal pregnancy, antepartum; H/O: cesarean section; and Round ligament pain on her problem list.  Patient reports no complaints.  Contractions: Not present. Vag. Bleeding: None.  Movement: Present. Denies leaking of fluid.   The following portions of the patient's history were reviewed and updated as appropriate: allergies, current medications, past family history, past medical history, past social history, past surgical history and problem list. Problem list updated.  Objective:   Vitals:   12/09/16 0840  BP: 120/89  Pulse: (!) 108  Weight: 255 lb 4.8 oz (115.8 kg)    Fetal Status: Fetal Heart Rate (bpm): 152 Fundal Height: 28 cm Movement: Present     General:  Alert, oriented and cooperative. Patient is in no acute distress.  Skin: Skin is warm and dry. No rash noted.   Cardiovascular: Normal heart rate noted  Respiratory: Normal respiratory effort, no problems with respiration noted  Abdomen: Soft, gravid, appropriate for gestational age. Pain/Pressure: Present     Pelvic:  Cervical exam deferred        Extremities: Normal range of motion.  Edema: Trace  Mental Status: Normal mood and affect. Normal behavior. Normal judgment and thought content.   Assessment and Plan:  Pregnancy: G3P1011 at 3616w4d  1. Supervision of other normal pregnancy, antepartum     Doing well - Glucose Tolerance, 2 Hours w/1 Hour - HIV antibody - RPR - CBC - Tdap vaccine greater than or equal to 7yo IM  2. H/O: cesarean section     TOLAC in media file singed 10/15/16  3. Round ligament pain     Has maternity support belt, comfort measures discussed  4. Severe obesity (BMI >= 40) (HCC)     27 lb weight gain.   Preterm labor  symptoms and general obstetric precautions including but not limited to vaginal bleeding, contractions, leaking of fluid and fetal movement were reviewed in detail with the patient. Please refer to After Visit Summary for other counseling recommendations.  Return in about 2 weeks (around 12/23/2016) for ROB.   Roe Coombsenney, Omarrion Carmer A, CNM

## 2016-12-10 LAB — GLUCOSE TOLERANCE, 2 HOURS W/ 1HR
GLUCOSE, 2 HOUR: 110 mg/dL (ref 65–152)
Glucose, 1 hour: 133 mg/dL (ref 65–179)
Glucose, Fasting: 72 mg/dL (ref 65–91)

## 2016-12-10 LAB — CBC
HEMATOCRIT: 34.1 % (ref 34.0–46.6)
HEMOGLOBIN: 11.3 g/dL (ref 11.1–15.9)
MCH: 28.7 pg (ref 26.6–33.0)
MCHC: 33.1 g/dL (ref 31.5–35.7)
MCV: 87 fL (ref 79–97)
PLATELETS: 265 10*3/uL (ref 150–379)
RBC: 3.94 x10E6/uL (ref 3.77–5.28)
RDW: 14.3 % (ref 12.3–15.4)
WBC: 11.2 10*3/uL — AB (ref 3.4–10.8)

## 2016-12-10 LAB — HIV ANTIBODY (ROUTINE TESTING W REFLEX): HIV Screen 4th Generation wRfx: NONREACTIVE

## 2016-12-10 LAB — RPR: RPR Ser Ql: NONREACTIVE

## 2016-12-18 ENCOUNTER — Other Ambulatory Visit: Payer: Self-pay | Admitting: Certified Nurse Midwife

## 2016-12-18 DIAGNOSIS — O9989 Other specified diseases and conditions complicating pregnancy, childbirth and the puerperium: Principal | ICD-10-CM

## 2016-12-18 DIAGNOSIS — O99891 Other specified diseases and conditions complicating pregnancy: Secondary | ICD-10-CM

## 2016-12-23 ENCOUNTER — Encounter: Payer: Self-pay | Admitting: Certified Nurse Midwife

## 2016-12-23 ENCOUNTER — Ambulatory Visit (INDEPENDENT_AMBULATORY_CARE_PROVIDER_SITE_OTHER): Payer: BLUE CROSS/BLUE SHIELD | Admitting: Certified Nurse Midwife

## 2016-12-23 VITALS — BP 125/80 | HR 99 | Temp 98.0°F | Wt 259.0 lb

## 2016-12-23 DIAGNOSIS — J069 Acute upper respiratory infection, unspecified: Secondary | ICD-10-CM

## 2016-12-23 DIAGNOSIS — Z3483 Encounter for supervision of other normal pregnancy, third trimester: Secondary | ICD-10-CM | POA: Diagnosis not present

## 2016-12-23 DIAGNOSIS — Z98891 History of uterine scar from previous surgery: Secondary | ICD-10-CM

## 2016-12-23 DIAGNOSIS — Z3480 Encounter for supervision of other normal pregnancy, unspecified trimester: Secondary | ICD-10-CM

## 2016-12-23 MED ORDER — BENZONATATE 100 MG PO CAPS: 100.0000 mg | ORAL_CAPSULE | Freq: Three times a day (TID) | ORAL | 0 refills | Status: DC | PRN

## 2016-12-23 NOTE — Progress Notes (Signed)
   PRENATAL VISIT NOTE  Subjective:  Caitlyn Galvan is a 26 y.o. G3P1011 at 6454w4d being seen today for ongoing prenatal care.  She is currently monitored for the following issues for this low-risk pregnancy and has Severe obesity (BMI >= 40) (HCC); Supervision of normal pregnancy, antepartum; H/O: cesarean section; Round ligament pain; and Current maternal condition affecting pregnancy on her problem list.  Patient reports backache, no bleeding, no contractions, no cramping and no leaking. URI symptoms, denies fever, reports nasal congestion, cough, no sputum production  Contractions: Not present. Vag. Bleeding: None.  Movement: Present. Denies leaking of fluid.   The following portions of the patient's history were reviewed and updated as appropriate: allergies, current medications, past family history, past medical history, past social history, past surgical history and problem list. Problem list updated.  Objective:   Vitals:   12/23/16 1549  BP: 125/80  Pulse: 99  Temp: 98 F (36.7 C)  Weight: 259 lb (117.5 kg)    Fetal Status: Fetal Heart Rate (bpm): 155 Fundal Height: 31 cm Movement: Present     General:  Alert, oriented and cooperative. Patient is in no acute distress.  Skin: Skin is warm and dry. No rash noted.   Cardiovascular: Normal heart rate noted  Respiratory: Normal respiratory effort, no problems with respiration noted  Abdomen: Soft, gravid, appropriate for gestational age. Pain/Pressure: Present     Pelvic:  Cervical exam deferred        Extremities: Normal range of motion.     Mental Status: Normal mood and affect. Normal behavior. Normal judgment and thought content.   Assessment and Plan:  Pregnancy: G3P1011 at 2454w4d  1. Supervision of other normal pregnancy, antepartum      Acute URI  2. Severe obesity (BMI >= 40) (HCC)        3. H/O: cesarean section     TOLAC planned, consent in media file  4. Acute URI    OTC mucinex, benadryl -  benzonatate (TESSALON PERLES) 100 MG capsule; Take 1 capsule (100 mg total) by mouth 3 (three) times daily as needed for cough.  Dispense: 30 capsule; Refill: 0  Preterm labor symptoms and general obstetric precautions including but not limited to vaginal bleeding, contractions, leaking of fluid and fetal movement were reviewed in detail with the patient. Please refer to After Visit Summary for other counseling recommendations.  Return in about 2 weeks (around 01/06/2017) for babyscripts, ROB.   Roe Coombsachelle A Oiva Dibari, CNM

## 2016-12-23 NOTE — Progress Notes (Signed)
Pt states that she has been feeling baby move more at night, please discuss. Pt states that she has had cold since Thursday, sinus pain/pressure, runny nose and cough.

## 2016-12-30 ENCOUNTER — Other Ambulatory Visit: Payer: Self-pay | Admitting: Certified Nurse Midwife

## 2016-12-30 ENCOUNTER — Encounter: Payer: Self-pay | Admitting: Certified Nurse Midwife

## 2017-01-01 ENCOUNTER — Encounter: Payer: Self-pay | Admitting: Certified Nurse Midwife

## 2017-01-05 ENCOUNTER — Other Ambulatory Visit: Payer: Self-pay | Admitting: Certified Nurse Midwife

## 2017-01-07 ENCOUNTER — Ambulatory Visit (INDEPENDENT_AMBULATORY_CARE_PROVIDER_SITE_OTHER): Payer: BLUE CROSS/BLUE SHIELD | Admitting: Physician Assistant

## 2017-01-07 ENCOUNTER — Encounter: Payer: Self-pay | Admitting: Physician Assistant

## 2017-01-07 VITALS — BP 127/82 | HR 97 | Temp 98.6°F | Resp 18 | Ht 63.0 in | Wt 262.3 lb

## 2017-01-07 DIAGNOSIS — R059 Cough, unspecified: Secondary | ICD-10-CM

## 2017-01-07 DIAGNOSIS — J209 Acute bronchitis, unspecified: Secondary | ICD-10-CM

## 2017-01-07 DIAGNOSIS — R0981 Nasal congestion: Secondary | ICD-10-CM | POA: Diagnosis not present

## 2017-01-07 DIAGNOSIS — R05 Cough: Secondary | ICD-10-CM

## 2017-01-07 MED ORDER — PROMETHAZINE-DM 6.25-15 MG/5ML PO SYRP: 5.0000 mL | ORAL_SOLUTION | Freq: Three times a day (TID) | ORAL | 0 refills | Status: DC

## 2017-01-07 MED ORDER — AZITHROMYCIN 250 MG PO TABS: ORAL_TABLET | ORAL | 0 refills | Status: DC

## 2017-01-07 NOTE — Progress Notes (Signed)
Caitlyn Galvan  MRN: [id number] DOB: [birth year]  PCP: Patient, No Pcp Per  Subjective:  Pt is a 26 year old female who presents to clinic for cough and nasal congestion x 2 weeks. Endorses headache. Her cough is worsening.  She is not sleeping well due to cough.  Denies fever, chills, wheezing, shob, n/v/d.  She has been taking Robitussen, Cold-Eaze, Benadryl, Tessalon, Claritin.   No seasonal allergies.  She is currently pregnant.   Review of Systems  Constitutional: Negative for chills, diaphoresis, fatigue and fever.  HENT: Positive for congestion. Negative for sinus pain, sinus pressure, sneezing and sore throat.   Respiratory: Positive for cough. Negative for chest tightness, shortness of breath and wheezing.   Neurological: Positive for headaches.  Psychiatric/Behavioral: Positive for sleep disturbance.    Patient Active Problem List   Diagnosis Date Noted  . Current maternal condition affecting pregnancy 12/18/2016  . Round ligament pain 11/11/2016  . Supervision of normal pregnancy, antepartum 08/19/2016  . H/O: cesarean section 08/19/2016  . Severe obesity (BMI >= 40) (HCC) 10/19/2014    Current Outpatient Prescriptions on File Prior to Visit  Medication Sig Dispense Refill  . acetaminophen (TYLENOL) 650 MG CR tablet Take 650 mg by mouth every 8 (eight) hours as needed for pain.    . benzonatate (TESSALON PERLES) 100 MG capsule Take 1 capsule (100 mg total) by mouth 3 (three) times daily as needed for cough. 30 capsule 0  . Butalbital-APAP-Caffeine 50-300-40 MG CAPS Take 1 capsule by mouth 2 (two) times daily as needed (for headache).    . Elastic Bandages & Supports (COMFORT FIT MATERNITY SUPP LG) MISC 1 Units by Does not apply route daily. 1 each 0  . Prenat-FeAsp-Meth-FA-DHA w/o A (PRENATE PIXIE) 10-0.6-0.4-200 MG CAPS Take 1 capsule by mouth daily.    . progesterone (PROMETRIUM) 200 MG capsule Place 200 mg vaginally at bedtime.    . ranitidine (ZANTAC) 150  MG tablet Take 1 tablet (150 mg total) by mouth 2 (two) times daily. 60 tablet 5   No current facility-administered medications on file prior to visit.     No Known Allergies   Objective:  BP 127/82   Pulse 97   Temp 98.6 F (37 C) (Oral)   Resp 18   Ht 5\' 3"  (1.6 m)   Wt 262 lb 4.8 oz (119 kg)   LMP 05/23/2016 (Approximate)   SpO2 97%   BMI 46.46 kg/m   Physical Exam  Constitutional: She is oriented to person, place, and time and well-developed, well-nourished, and in no distress. No distress.  HENT:  Right Ear: Tympanic membrane normal.  Left Ear: Tympanic membrane normal.  Nose: Mucosal edema present. Right sinus exhibits no maxillary sinus tenderness and no frontal sinus tenderness. Left sinus exhibits no maxillary sinus tenderness and no frontal sinus tenderness.  Mouth/Throat: Oropharynx is clear and moist and mucous membranes are normal.  Cardiovascular: Normal rate, regular rhythm and normal heart sounds.   Pulmonary/Chest: Effort normal and breath sounds normal. She has no wheezes. She has no rales.  Neurological: She is alert and oriented to person, place, and time. GCS score is 15.  Skin: Skin is warm and dry.  Psychiatric: Mood, memory, affect and judgment normal.  Vitals reviewed.   Assessment and Plan :  1. Acute bronchitis, unspecified organism 2. Cough 3. Nasal congestion - azithromycin (ZITHROMAX) 250 MG tablet; Take 2 tabs PO x 1 dose, then 1 tab PO QD x 4 days  Dispense: 6  tablet; Refill: 0 - promethazine-dextromethorphan (PROMETHAZINE-DM) 6.25-15 MG/5ML syrup; Take 5 mLs by mouth 3 (three) times daily.  Dispense: 118 mL; Refill: 0 - Supportive care: push fluids and rest. RTC in 5-7 days if no improvement. She agrees with plan.   Marco CollieWhitney Deondria Puryear, PA-C  Primary Care at [facility] 01/07/2017 8:59 AM

## 2017-01-07 NOTE — Patient Instructions (Addendum)
Stay well hydrated. Drink warm tea with honey.  Come back in 5-7 days if you are not better.  Flonase will help your nasal congestion.   Thank you for coming in today. I hope you feel we met your needs.  Feel free to call [facility] if you have any questions or further requests.  Please consider signing up for MyChart if you do not already have it, as this is a great way to communicate with me.  Best,  Whitney McVey, PA-C   IF you received an x-ray today, you will receive an invoice from [facility]. Please contact [facility] at [phone] with questions or concerns regarding your invoice.   IF you received labwork today, you will receive an invoice from [facility]. Please contact [facility] at [phone] with questions or concerns regarding your invoice.   Our billing staff will not be able to assist you with questions regarding bills from these companies.  You will be contacted with the lab results as soon as they are available. The fastest way to get your results is to activate your My Chart account. Instructions are located on the last page of this paperwork. If you have not heard from [place] regarding the results in 2 weeks, please contact this office.

## 2017-01-12 ENCOUNTER — Emergency Department (HOSPITAL_COMMUNITY)
Admission: EM | Admit: 2017-01-12 | Discharge: 2017-01-12 | Disposition: A | Discharge status: Home / Self Care | Payer: Medicaid Other | Attending: Emergency Medicine | Admitting: Emergency Medicine

## 2017-01-12 ENCOUNTER — Encounter (HOSPITAL_COMMUNITY): Payer: Self-pay

## 2017-01-12 DIAGNOSIS — Z3A33 33 weeks gestation of pregnancy: Secondary | ICD-10-CM | POA: Insufficient documentation

## 2017-01-12 DIAGNOSIS — L0201 Cutaneous abscess of face: Secondary | ICD-10-CM | POA: Insufficient documentation

## 2017-01-12 DIAGNOSIS — Z79899 Other long term (current) drug therapy: Secondary | ICD-10-CM | POA: Diagnosis not present

## 2017-01-12 DIAGNOSIS — O99713 Diseases of the skin and subcutaneous tissue complicating pregnancy, third trimester: Secondary | ICD-10-CM | POA: Diagnosis not present

## 2017-01-12 DIAGNOSIS — Z87891 Personal history of nicotine dependence: Secondary | ICD-10-CM | POA: Insufficient documentation

## 2017-01-12 MED ORDER — LIDOCAINE HCL (PF) 1 % IJ SOLN
5.0000 mL | Freq: Once | INTRAMUSCULAR | Status: AC
  Administered 2017-01-12: 5 mL
  Filled 2017-01-12: qty 30

## 2017-01-12 MED ORDER — LIDOCAINE HCL (PF) 1 % IJ SOLN
INTRAMUSCULAR | Status: AC
  Administered 2017-01-12: 13:00:00
  Filled 2017-01-12: qty 30

## 2017-01-12 NOTE — Discharge Instructions (Signed)
Read the information below.  You may return to the Emergency Department at any time for worsening condition or any new symptoms that concern you.   If you develop redness, swelling, worsening pain, or fevers greater than 100.4, return to the ER immediately for a recheck.

## 2017-01-12 NOTE — ED Triage Notes (Signed)
Patient is currently pregnant at [redacted] weeks.

## 2017-01-12 NOTE — ED Provider Notes (Signed)
[facility]-EMERGENCY DEPT Provider Note   CSN: [id number] Arrival date & time: 01/12/17  1009  By signing my name below, I, Deland Pretty, attest that this documentation has been prepared under the direction and in the presence of Trixie Dredge, PA-C Electronically Signed: Deland Pretty, ED Scribe. 01/12/17. 11:55 AM.  History   Chief Complaint Chief Complaint  Patient presents with  . Abscess    The history is provided by the patient. No language interpreter was used.    HPI Comments: Caitlyn Galvan is a [redacted] week pregnant EDC 01/28/17 26 y.o. female who presents to the Emergency Department complaining of a 4/10 throbbing facial pain associated with a left facial abscess that began four days ago. She reports that the abscess began draining 2 days ago. The pt has had one episode of vomiting which may be due to her pregnancy. She  indicates that she was prescribed Z-pac by her PCP for a URI that she finished yesterday.  She also indicates that she has applied a hot compress to the site to help alleviate her symptoms. She also tried a topical OTC cream both with inadequate relief. Her pain is not exacerbated by eating, and she has not had similar symptoms in the past. The pt denies fever, chills, diaphoresis, dental pain, abdominal pain, fluid from the vagina, vaginal bleeding, and trouble swallowing.  She is experiencing normal fetal movement.      Past Medical History:  Diagnosis Date  . Anxiety   . Depression   . Headache   . No pertinent past medical history     Patient Active Problem List   Diagnosis Date Noted  . Current maternal condition affecting pregnancy 12/18/2016  . Round ligament pain 11/11/2016  . Supervision of normal pregnancy, antepartum 08/19/2016  . H/O: cesarean section 08/19/2016  . Severe obesity (BMI >= 40) (HCC) 10/19/2014    Past Surgical History:  Procedure Laterality Date  . ADENOIDECTOMY    . adnoids    . CESAREAN SECTION    . DILATION AND  CURETTAGE OF UTERUS    . DILATION AND EVACUATION N/A 07/19/2015   Procedure: DILATATION AND EVACUATION;  Surgeon: Mitchel Honour, DO;  Location: [facility] ORS;  Service: Gynecology;  Laterality: N/A;  . tubes in ears    . WISDOM TOOTH EXTRACTION      OB History    Gravida Para Term Preterm AB Living   3 1 1  0 1 1   SAB TAB Ectopic Multiple Live Births   1 0 0 0         Home Medications    Prior to Admission medications   Medication Sig Start Date End Date Taking? Authorizing Provider  acetaminophen (TYLENOL) 650 MG CR tablet Take 650 mg by mouth every 8 (eight) hours as needed for pain.    [provider]  azithromycin (ZITHROMAX) 250 MG tablet Take 2 tabs PO x 1 dose, then 1 tab PO QD x 4 days 01/07/17   McVey, Madelaine Bhat, PA-C  benzonatate (TESSALON PERLES) 100 MG capsule Take 1 capsule (100 mg total) by mouth 3 (three) times daily as needed for cough. 12/23/16   Orvilla Cornwall A, CNM  Butalbital-APAP-Caffeine 50-300-40 MG CAPS Take 1 capsule by mouth 2 (two) times daily as needed (for headache).    [provider]  Elastic Bandages & Supports (COMFORT FIT MATERNITY SUPP LG) MISC 1 Units by Does not apply route daily. 11/11/16   Roe Coombs, CNM  Prenat-FeAsp-Meth-FA-DHA w/o A (PRENATE  PIXIE) 10-0.6-0.4-200 MG CAPS Take 1 capsule by mouth daily.    [provider]  progesterone (PROMETRIUM) 200 MG capsule Place 200 mg vaginally at bedtime.    [provider]  promethazine-dextromethorphan (PROMETHAZINE-DM) 6.25-15 MG/5ML syrup Take 5 mLs by mouth 3 (three) times daily. 01/07/17   McVey, Madelaine Bhat, PA-C  ranitidine (ZANTAC) 150 MG tablet Take 1 tablet (150 mg total) by mouth 2 (two) times daily. 10/30/16   Roe Coombs, CNM    Family History No family history on file.  Social History Social History  Substance Use Topics  . Smoking status: Former Smoker    Packs/day: 0.50    Years: 2.00    Types: Cigarettes, E-cigarettes     Quit date: 06/16/2014  . Smokeless tobacco: Never Used     Comment: 1/2 pack a week  . Alcohol use 0.0 oz/week     Comment: occasionally; not while preg     Allergies   Patient has no known allergies.   Review of Systems Review of Systems  Constitutional: Negative for chills, diaphoresis and fever.  HENT: Positive for facial swelling. Negative for dental problem, sore throat and trouble swallowing.   Gastrointestinal: Negative for abdominal pain.  Genitourinary: Negative for vaginal bleeding and vaginal discharge.  Skin: Positive for wound.     Physical Exam Updated Vital Signs BP 134/80   Pulse 89   Temp 97.8 F (36.6 C)   Resp 18   Ht 5\' 3"  (1.6 m)   Wt 118.8 kg (262 lb)   LMP 05/23/2016 (Approximate)   SpO2 100%   BMI 46.41 kg/m   Physical Exam  Constitutional: She appears well-developed and well-nourished. No distress.  HENT:  Head: Normocephalic and atraumatic.    Neck: Neck supple.  Pulmonary/Chest: Effort normal.  Neurological: She is alert.  Skin: She is not diaphoretic.  Nursing note and vitals reviewed.    ED Treatments / Results   DIAGNOSTIC STUDIES: Oxygen Saturation is 97% on RA, adequate by my interpretation.   COORDINATION OF CARE: 10:40 AM-Discussed next steps with pt. Pt verbalized understanding and is agreeable with the plan.   Labs (all labs ordered are listed, but only abnormal results are displayed) Labs Reviewed - No data to display  EKG  EKG Interpretation None       Radiology No results found.  Procedures .Marland KitchenIncision and Drainage Date/Time: 01/12/2017 11:55 AM Performed by: Trixie Dredge Authorized by: Vanetta Mulders   Consent:    Consent obtained:  Verbal   Consent given by:  Patient   Risks discussed:  Bleeding, incomplete drainage and infection (scar)   Alternatives discussed:  No treatment and delayed treatment Universal protocol:    Procedure explained and questions answered to patient or proxy's  satisfaction: yes     Relevant documents present and verified: yes     Test results available and properly labeled: yes   Location:    Type:  Abscess   Size:  2cm   Location:  Head   Head location:  Face (left jawline) Pre-procedure details:    Skin preparation:  Betadine Anesthesia (see MAR for exact dosages):    Anesthesia method:  Local infiltration   Local anesthetic:  Lidocaine 1% w/o epi Procedure type:    Complexity:  Complex Procedure details:    Needle aspiration: no     Drainage:  Purulent and bloody   Drainage amount:  Moderate   Packing materials:  1/4 in iodoform gauze Post-procedure details:    Patient  tolerance of procedure:  Tolerated well, no immediate complications     (including critical care time)  INCISION AND DRAINAGE Performed by: Trixie DredgeWEST, Lachlan Mckim and Jerelyn CharlesGracie Blackley, PA-S  Consent: Verbal consent obtained. Risks and benefits: risks, benefits and alternatives were discussed Type: abscess  Body area: left jawline  Anesthesia: local infiltration  Incision was made with a scalpel.  Local anesthetic: lidocaine 1% w/o epinephrine  Anesthetic total: 3cc  Complexity: complex Blunt dissection to break up loculations  Drainage: purulent  Drainage amount: moderate  Packing material: 1/4 in iodoform gauze  Patient tolerance: Patient tolerated the procedure well with no immediate complications.     Medications Ordered in ED Medications  lidocaine (PF) (XYLOCAINE) 1 % injection 5 mL (5 mLs Infiltration Given by Other 01/12/17 1051)  lidocaine (PF) (XYLOCAINE) 1 % injection (  Given by Other 01/12/17 1236)     Initial Impression / Assessment and Plan / ED Course  I have reviewed the triage vital signs and the nursing notes.  Pertinent labs & imaging results that were available during my care of the patient were reviewed by me and considered in my medical decision making (see chart for details).     Afebrile, nontoxic patient with left facial  abscess without associated cellulitis.  I&D in department.   D/C home with wound care instructions, return precautions.  Discussed result, findings, treatment, and follow up  with patient.  Pt given return precautions.  Pt verbalizes understanding and agrees with plan.       Final Clinical Impressions(s) / ED Diagnoses   Final diagnoses:  Facial abscess    New Prescriptions Discharge Medication List as of 01/12/2017 12:21 PM     I personally performed the services described in this documentation, which was scribed in my presence. The recorded information has been reviewed and is accurate.     Trixie DredgeWest, Burr Soffer, PA-C 01/12/17 1554    Vanetta MuldersZackowski, Scott, MD 01/14/17 [phone]

## 2017-01-12 NOTE — ED Triage Notes (Signed)
Patient has a left facial abscess that the patient states began 4 days ago.patient deneis any problems swallowing.

## 2017-01-12 NOTE — ED Notes (Signed)
Bed: WTR5 Expected date:  Expected time:  Means of arrival:  Comments: 

## 2017-01-20 ENCOUNTER — Ambulatory Visit (INDEPENDENT_AMBULATORY_CARE_PROVIDER_SITE_OTHER): Payer: BLUE CROSS/BLUE SHIELD | Admitting: Certified Nurse Midwife

## 2017-01-20 ENCOUNTER — Encounter: Payer: Self-pay | Admitting: Certified Nurse Midwife

## 2017-01-20 VITALS — BP 143/85 | HR 98 | Wt 268.2 lb

## 2017-01-20 DIAGNOSIS — N949 Unspecified condition associated with female genital organs and menstrual cycle: Secondary | ICD-10-CM

## 2017-01-20 DIAGNOSIS — Z3483 Encounter for supervision of other normal pregnancy, third trimester: Secondary | ICD-10-CM

## 2017-01-20 DIAGNOSIS — O163 Unspecified maternal hypertension, third trimester: Secondary | ICD-10-CM

## 2017-01-20 DIAGNOSIS — Z3480 Encounter for supervision of other normal pregnancy, unspecified trimester: Secondary | ICD-10-CM

## 2017-01-20 DIAGNOSIS — Z98891 History of uterine scar from previous surgery: Secondary | ICD-10-CM

## 2017-01-20 NOTE — Progress Notes (Signed)
Patient reports she has more swelling in her L ankle than the other.

## 2017-01-20 NOTE — Progress Notes (Signed)
   PRENATAL VISIT NOTE  Subjective:  Caitlyn Galvan is a 26 y.o. G3P1011 at 66w4dbeing seen today for ongoing prenatal care.  She is currently monitored for the following issues for this low-risk pregnancy and has [place] >= 40[place] Supervision of normal pregnancy, antepartum; H/O: cesarean section; Round ligament pain; and Current maternal condition affecting pregnancy on her problem list.  Patient reports no complaints.  Contractions: Not present. Vag. Bleeding: None.  Movement: Present. Denies leaking of fluid.   The following portions of the patient's history were reviewed and updated as appropriate: allergies, current medications, past family history, past medical history, past social history, past surgical history and problem list. Problem list updated.  Objective:   Vitals:   01/20/17 0842  BP: (!) 143/85  Pulse: 98  Weight: 268 lb 3.2 oz (121.7 kg)    Fetal Status: Fetal Heart Rate (bpm): 150 Fundal Height: 35 cm Movement: Present     General:  Alert, oriented and cooperative. Patient is in no acute distress.  Skin: Skin is warm and dry. No rash noted.   Cardiovascular: Normal heart rate noted  Respiratory: Normal respiratory effort, no problems with respiration noted  Abdomen: Soft, gravid, appropriate for gestational age. Pain/Pressure: Present     Pelvic:  Cervical exam deferred        Extremities: Normal range of motion.  Edema: None  Mental Status: Normal mood and affect. Normal behavior. Normal judgment and thought content.   Assessment and Plan:  Pregnancy: G3P1011 at 386w4d1. Supervision of other normal pregnancy, antepartum     Elevated blood pressure today  2. Severe obesity (BMI >= 40) (HCC)      40 lb weight gain this pregnancy  3. H/O: cesarean section     TOLAC  4. Round ligament pain     Has maternity support belt  5. Elevated blood pressure complicating pregnancy in third trimester, antepartum      Denies HA, Upper gastric  pain and visual changes - Comp Met (CMET) - CBC - Protein / creatinine ratio, urine - Creatinine clearance, urine, 24 hour; Future - Protein, urine, 24 hour; Future  Preterm  labor symptoms and general obstetric precautions including but not limited to vaginal bleeding, contractions, leaking of fluid and fetal movement were reviewed in detail with the patient. Please refer to After Visit Summary for other counseling recommendations.  Return in about 1 week (around 01/27/2017) for ROB, GBS, nurse visit blood pressure check Friday.   RaMorene CrockerCNM

## 2017-01-21 LAB — COMPREHENSIVE METABOLIC PANEL
ALBUMIN: 3.6 g/dL (ref 3.5–5.5)
ALT: 13 IU/L (ref 0–32)
AST: 16 IU/L (ref 0–40)
Albumin/Globulin Ratio: 1.3 (ref 1.2–2.2)
Alkaline Phosphatase: 75 IU/L (ref 39–117)
BUN / CREAT RATIO: 10 (ref 9–23)
BUN: 5 mg/dL — ABNORMAL LOW (ref 6–20)
Bilirubin Total: <0.2 mg/dL (ref 0.0–1.2)
CO2: 21 mmol/L (ref 20–29)
CREATININE: 0.49 mg/dL — AB (ref 0.57–1.00)
Calcium: 8.9 mg/dL (ref 8.7–10.2)
Chloride: 103 mmol/L (ref 96–106)
GFR, EST AFRICAN AMERICAN: 157 mL/min/{1.73_m2} (ref >59)
GFR, EST NON AFRICAN AMERICAN: 136 mL/min/{1.73_m2} (ref >59)
GLOBULIN, TOTAL: 2.8 g/dL (ref 1.5–4.5)
Glucose: 81 mg/dL (ref 65–99)
Potassium: 3.9 mmol/L (ref 3.5–5.2)
Sodium: 139 mmol/L (ref 134–144)
TOTAL PROTEIN: 6.4 g/dL (ref 6.0–8.5)

## 2017-01-21 LAB — PROTEIN / CREATININE RATIO, URINE
Creatinine, Urine: 90.8 mg/dL
Protein, Ur: 16.7 mg/dL
Protein/Creat Ratio: 184 mg/g creat (ref 0–200)

## 2017-01-21 LAB — CBC
HEMATOCRIT: 36.2 % (ref 34.0–46.6)
Hemoglobin: 11.6 g/dL (ref 11.1–15.9)
MCH: 28 pg (ref 26.6–33.0)
MCHC: 32 g/dL (ref 31.5–35.7)
MCV: 87 fL (ref 79–97)
PLATELETS: 293 10*3/uL (ref 150–379)
RBC: 4.14 x10E6/uL (ref 3.77–5.28)
RDW: 15.1 % (ref 12.3–15.4)
WBC: 11.9 10*3/uL — ABNORMAL HIGH (ref 3.4–10.8)

## 2017-01-22 ENCOUNTER — Encounter: Payer: Self-pay | Admitting: Certified Nurse Midwife

## 2017-01-22 ENCOUNTER — Ambulatory Visit: Payer: BLUE CROSS/BLUE SHIELD | Admitting: *Deleted

## 2017-01-22 ENCOUNTER — Other Ambulatory Visit: Payer: Self-pay | Admitting: Certified Nurse Midwife

## 2017-01-22 ENCOUNTER — Encounter (HOSPITAL_COMMUNITY): Payer: Self-pay | Admitting: *Deleted

## 2017-01-22 ENCOUNTER — Inpatient Hospital Stay (HOSPITAL_COMMUNITY)
Admission: AD | Admit: 2017-01-22 | Discharge: 2017-01-22 | Disposition: A | Discharge status: Home / Self Care | Payer: Medicaid Other | Source: Ambulatory Visit | Attending: Obstetrics & Gynecology | Admitting: Obstetrics & Gynecology

## 2017-01-22 DIAGNOSIS — O09893 Supervision of other high risk pregnancies, third trimester: Secondary | ICD-10-CM | POA: Insufficient documentation

## 2017-01-22 DIAGNOSIS — O163 Unspecified maternal hypertension, third trimester: Secondary | ICD-10-CM

## 2017-01-22 DIAGNOSIS — Z87891 Personal history of nicotine dependence: Secondary | ICD-10-CM | POA: Diagnosis not present

## 2017-01-22 DIAGNOSIS — O133 Gestational [pregnancy-induced] hypertension without significant proteinuria, third trimester: Secondary | ICD-10-CM | POA: Insufficient documentation

## 2017-01-22 DIAGNOSIS — O99343 Other mental disorders complicating pregnancy, third trimester: Secondary | ICD-10-CM | POA: Diagnosis not present

## 2017-01-22 DIAGNOSIS — Z6841 Body Mass Index (BMI) 40.0 and over, adult: Secondary | ICD-10-CM | POA: Diagnosis not present

## 2017-01-22 DIAGNOSIS — O99213 Obesity complicating pregnancy, third trimester: Secondary | ICD-10-CM | POA: Diagnosis not present

## 2017-01-22 DIAGNOSIS — F419 Anxiety disorder, unspecified: Secondary | ICD-10-CM | POA: Insufficient documentation

## 2017-01-22 DIAGNOSIS — Z3A34 34 weeks gestation of pregnancy: Secondary | ICD-10-CM | POA: Insufficient documentation

## 2017-01-22 LAB — CBC
HCT: 33.9 % — ABNORMAL LOW (ref 36.0–46.0)
HEMOGLOBIN: 11.3 g/dL — AB (ref 12.0–15.0)
MCH: 28.3 pg (ref 26.0–34.0)
MCHC: 33.3 g/dL (ref 30.0–36.0)
MCV: 85 fL (ref 78.0–100.0)
Platelets: 339 10*3/uL (ref 150–400)
RBC: 3.99 MIL/uL (ref 3.87–5.11)
RDW: 14.9 % (ref 11.5–15.5)
WBC: 12.8 10*3/uL — ABNORMAL HIGH (ref 4.0–10.5)

## 2017-01-22 LAB — COMPREHENSIVE METABOLIC PANEL
ALBUMIN: 3 g/dL — AB (ref 3.5–5.0)
ALK PHOS: 68 U/L (ref 38–126)
ALT: 21 U/L (ref 14–54)
ANION GAP: 7 (ref 5–15)
AST: 24 U/L (ref 15–41)
BUN: 7 mg/dL (ref 6–20)
CALCIUM: 8.7 mg/dL — AB (ref 8.9–10.3)
CHLORIDE: 103 mmol/L (ref 101–111)
CO2: 25 mmol/L (ref 22–32)
Creatinine, Ser: 0.41 mg/dL — ABNORMAL LOW (ref 0.44–1.00)
GFR calc non Af Amer: >60 mL/min (ref >60)
GLUCOSE: 84 mg/dL (ref 65–99)
Potassium: 3.5 mmol/L (ref 3.5–5.1)
SODIUM: 135 mmol/L (ref 135–145)
Total Bilirubin: 0.2 mg/dL — ABNORMAL LOW (ref 0.3–1.2)
Total Protein: 7.1 g/dL (ref 6.5–8.1)

## 2017-01-22 LAB — PROTEIN / CREATININE RATIO, URINE: Creatinine, Urine: 33 mg/dL

## 2017-01-22 NOTE — MAU Provider Note (Signed)
Obstetric Attending MAU Note  Chief Complaint:  Hypertension  HPI: Caitlyn Galvan is a 26 y.o. G3P1011 at 6557w6d who presents to maternity admissions for evaluation of elevated BP.  Had BP 143/85 on 6/19 at 6270w4d with negative labs.  Recheck today in office was 149/92.  She was sent here for evaluation. On encounter here, she denies any current headaches, visual changes, RUQ/epigastric pain, nausea or vomiting.  Denies contractions, leakage of fluid or vaginal bleeding. Good fetal movement.   P[facility] Course[facility] Patient Active Problem List   Diagnosis Date Noted  . Gestational hypertension without significant proteinuria in third trimester 01/22/2017  . Current maternal condition affecting pregnancy 12/18/2016  . Supervision of high-risk pregnancy 08/19/2016  . H/O: cesarean section 08/19/2016  . Severe obesity (BMI >= 40) (HCC) 10/19/2014    Past Medical History:  Diagnosis Date  . Anxiety   . Depression   . Headache   . No pertinent past medical history     OB History  Gravida Para Term Preterm AB Living  3 1 1  0 1 1  SAB TAB Ectopic Multiple Live Births  1 0 0 0      # Outcome Date GA Lbr Len/2nd Weight Sex Delivery Anes PTL Lv  3 Current           2 Term 01/26/11    Judie PetitM CS-LTranv        Birth Comments: FTP  1 SAB               Past Surgical History:  Procedure Laterality Date  . ADENOIDECTOMY    . adnoids    . CESAREAN SECTION    . DILATION AND CURETTAGE OF UTERUS    . DILATION AND EVACUATION N/A 07/19/2015   Procedure: DILATATION AND EVACUATION;  Surgeon: Mitchel HonourMegan Morris, DO;  Location: [facility] ORS;  Service: Gynecology;  Laterality: N/A;  . INCISE AND DRAIN ABCESS    . tubes in ears    . WISDOM TOOTH EXTRACTION      Family History: No family history on file.  Social History: Social History  Substance Use Topics  . Smoking status: Former Smoker    Packs/day: 0.50    Years: 2.00    Types: Cigarettes, E-cigarettes    Quit date:  06/16/2014  . Smokeless tobacco: Former NeurosurgeonUser     Comment: 1/2 pack a week  . Alcohol use 0.0 oz/week     Comment: occasionally; not while preg    Allergies: No Known Allergies  Prescriptions Prior to Admission  Medication Sig Dispense Refill Last Dose  . acetaminophen (TYLENOL) 650 MG CR tablet Take 650 mg by mouth every 8 (eight) hours as needed for pain.   Taking  . Butalbital-APAP-Caffeine 50-300-40 MG CAPS Take 1 capsule by mouth 2 (two) times daily as needed (for headache).   Taking  . Elastic Bandages & Supports (COMFORT FIT MATERNITY SUPP LG) MISC 1 Units by Does not apply route daily. 1 each 0 Taking  . Prenat-FeAsp-Meth-FA-DHA w/o A (PRENATE PIXIE) 10-0.6-0.4-200 MG CAPS Take 1 capsule by mouth daily.   Taking  . progesterone (PROMETRIUM) 200 MG capsule Place 200 mg vaginally at bedtime.   Taking  . ranitidine (ZANTAC) 150 MG tablet Take 1 tablet (150 mg total) by mouth 2 (two) times daily. 60 tablet 5 Taking    ROS: Pertinent findings in history of present illness.  Physical Exam  Blood pressure 135/81, pulse 96, temperature 98.2 F (36.8 C), temperature source  Oral, resp. rate 18, weight 276 lb 1.9 oz (125.2 kg), last menstrual period 05/23/2016, unknown if currently breastfeeding. CONSTITUTIONAL: Well-developed, well-nourished female in no acute distress.  HENT:  Normocephalic, atraumatic, External right and left ear normal. Oropharynx is clear and moist EYES: Conjunctivae and EOM are normal. Pupils are equal, round, and reactive to light. No scleral icterus.  NECK: Normal range of motion, supple, no masses SKIN: Skin is warm and dry. No rash noted. Not diaphoretic. No erythema. No pallor. NEUROLGIC: Alert and oriented to person, place, and time. Normal reflexes, muscle tone coordination. No cranial nerve deficit noted. PSYCHIATRIC: Normal mood and affect. Normal behavior. Normal judgment and thought content. CARDIOVASCULAR: Normal heart rate noted, regular  rhythm RESPIRATORY: Effort and breath sounds normal, no problems with respiration noted ABDOMEN: Soft, nontender, nondistended, gravid appropriate for gestational age MUSCULOSKELETAL: Normal range of motion. No edema and no tenderness. 2+ distal pulses.  SPECULUM EXAM: Deferred    FHT:  Baseline 150 , moderate variability, accelerations present, no decelerations Contractions: None   Labs: Results for orders placed or performed during the hospital encounter of 01/22/17 (from the past 24 hour(s))  Protein / creatinine ratio, urine     Status: None   Collection Time: 01/22/17  4:40 PM  Result Value Ref Range   Creatinine, Urine 33.00 mg/dL   Total Protein, Urine <6 mg/dL   Protein Creatinine Ratio        0.00 - 0.15 mg/mg[Cre]  CBC     Status: Abnormal   Collection Time: 01/22/17  4:49 PM  Result Value Ref Range   WBC 12.8 (H) 4.0 - 10.5 K/uL   RBC 3.99 3.87 - 5.11 MIL/uL   Hemoglobin 11.3 (L) 12.0 - 15.0 g/dL   HCT 40.9 (L) 81.1 - 91.4 %   MCV 85.0 78.0 - 100.0 fL   MCH 28.3 26.0 - 34.0 pg   MCHC 33.3 30.0 - 36.0 g/dL   RDW 78.2 95.6 - 21.3 %   Platelets 339 150 - 400 K/uL  Comprehensive metabolic panel     Status: Abnormal   Collection Time: 01/22/17  4:49 PM  Result Value Ref Range   Sodium 135 135 - 145 mmol/L   Potassium 3.5 3.5 - 5.1 mmol/L   Chloride 103 101 - 111 mmol/L   CO2 25 22 - 32 mmol/L   Glucose, Bld 84 65 - 99 mg/dL   BUN 7 6 - 20 mg/dL   Creatinine, Ser 0.86 (L) 0.44 - 1.00 mg/dL   Calcium 8.7 (L) 8.9 - 10.3 mg/dL   Total Protein 7.1 6.5 - 8.1 g/dL   Albumin 3.0 (L) 3.5 - 5.0 g/dL   AST 24 15 - 41 U/L   ALT 21 14 - 54 U/L   Alkaline Phosphatase 68 38 - 126 U/L   Total Bilirubin 0.2 (L) 0.3 - 1.2 mg/dL   GFR calc non Af Amer >60 >60 mL/min   GFR calc Af Amer >60 >60 mL/min   Anion gap 7 5 - 15    Imaging:  No results found.  MAU Course: Patient had reactive NST. No intervention needed for BP Patient Vitals for the past 24 hrs:  BP Temp Temp  src Pulse Resp Weight  01/22/17 1745 135/81 - - 96 - -  01/22/17 1730 140/81 - - 97 - -  01/22/17 1715 (!) 144/88 98.2 F (36.8 C) Oral (!) 102 18 -  01/22/17 1701 - - - - - 276 lb 1.9 oz (125.2 kg)  Assessment: 1. Gestational hypertension without significant proteinuria in third trimester   No signs of preeclampsia for now.  Plan: Discharge home Will need antenatal testing and IOL at 37 weeks (scheduled on 02/06/17 at 0730). May need need earlier delivery for any severe features. Work letter given to her excusing her from work until after pregnancy. Preeclampsia precautions reviewed Preterm labor precautions and fetal kick counts reviewed Follow up with OB provider  Follow-up Information    [facility] [place] Follow up on 01/28/2017.   Specialty:  Obstetrics and Gynecology Why:  2:30 pm for ROB and NST Contact information: [address] [place] [address] [phone]          Allergies as of 01/22/2017   No Known Allergies     Medication List    TAKE these medications   acetaminophen 650 MG CR tablet Commonly known as:  TYLENOL Take 650 mg by mouth every 8 (eight) hours as needed for pain.   Butalbital-APAP-Caffeine 50-300-40 MG Caps Take 1 capsule by mouth 2 (two) times daily as needed (for headache).   COMFORT FIT MATERNITY SUPP LG Misc 1 Units by Does not apply route daily.   PRENATE PIXIE 10-0.6-0.4-200 MG Caps Take 1 capsule by mouth daily.   progesterone 200 MG capsule Commonly known as:  PROMETRIUM Place 200 mg vaginally at bedtime.   ranitidine 150 MG tablet Commonly known as:  ZANTAC Take 1 tablet (150 mg total) by mouth 2 (two) times daily.       Tereso Newcomer, MD 01/22/2017 6:26 PM

## 2017-01-22 NOTE — MAU Note (Signed)
Sent from OB's office for further eval of hypertension;

## 2017-01-22 NOTE — Discharge Instructions (Signed)
Hypertension During Pregnancy °Hypertension, commonly called high blood pressure, is when the force of blood pumping through your arteries is too strong. Arteries are blood vessels that carry blood from the heart throughout the body. Hypertension during pregnancy can cause problems for you and your baby. Your baby may be born early (prematurely) or may not weigh as much as he or she should at birth. Very bad cases of hypertension during pregnancy can be life-threatening. °Different types of hypertension can occur during pregnancy. These include: °· Chronic hypertension. This happens when: °? You have hypertension before pregnancy and it continues during pregnancy. °? You develop hypertension before you are [redacted] weeks pregnant, and it continues during pregnancy. °· Gestational hypertension. This is hypertension that develops after the 20th week of pregnancy. °· Preeclampsia, also called toxemia of pregnancy. This is a very serious type of hypertension that develops only during pregnancy. It affects the whole body, and it can be very dangerous for you and your baby. ° °Gestational hypertension and preeclampsia usually go away within 6 weeks after your baby is born. Women who have hypertension during pregnancy have a greater chance of developing hypertension later in life or during future pregnancies. °What are the causes? °The exact cause of hypertension is not known. °What increases the risk? °There are certain factors that make it more likely for you to develop hypertension during pregnancy. These include: °· Having hypertension during a previous pregnancy or prior to pregnancy. °· Being overweight. °· Being older than age 40. °· Being pregnant for the first time or being pregnant with more than one baby. °· Becoming pregnant using fertilization methods such as IVF (in vitro fertilization). °· Having diabetes, kidney problems, or systemic lupus erythematosus. °· Having a family history of hypertension. ° °What are the  signs or symptoms? °Chronic hypertension and gestational hypertension rarely cause symptoms. Preeclampsia causes symptoms, which may include: °· Increased protein in your urine. Your health care provider will check for this at every visit before you give birth (prenatal visit). °· Severe headaches. °· Sudden weight gain. °· Swelling of the hands, face, legs, and feet. °· Nausea and vomiting. °· Vision problems, such as blurred or double vision. °· Numbness in the face, arms, legs, and feet. °· Dizziness. °· Slurred speech. °· Sensitivity to bright lights. °· Abdominal pain. °· Convulsions. ° °How is this diagnosed? °You may be diagnosed with hypertension during a routine prenatal exam. At each prenatal visit, you may: °· Have a urine test to check for high amounts of protein in your urine. °· Have your blood pressure checked. A blood pressure reading is recorded as two numbers, such as "120 over 80" (or 120/80). The first ("top") number is called the systolic pressure. It is a measure of the pressure in your arteries when your heart beats. The second ("bottom") number is called the diastolic pressure. It is a measure of the pressure in your arteries as your heart relaxes between beats. Blood pressure is measured in a unit called mm Hg. A normal blood pressure reading is: °? Systolic: below 120. °? Diastolic: below 80. ° °The type of hypertension that you are diagnosed with depends on your test results and when your symptoms developed. °· Chronic hypertension is usually diagnosed before 20 weeks of pregnancy. °· Gestational hypertension is usually diagnosed after 20 weeks of pregnancy. °· Hypertension with high amounts of protein in the urine is diagnosed as preeclampsia. °· Blood pressure measurements that stay above 160 systolic, or above 110 diastolic, are   signs of severe preeclampsia. ° °How is this treated? °Treatment for hypertension during pregnancy varies depending on the type of hypertension you have and how  serious it is. °· If you take medicines called ACE inhibitors to treat chronic hypertension, you may need to switch medicines. ACE inhibitors should not be taken during pregnancy. °· If you have gestational hypertension, you may need to take blood pressure medicine. °· If you are at risk for preeclampsia, your health care provider may recommend that you take a low-dose aspirin every day to prevent high blood pressure during your pregnancy. °· If you have severe preeclampsia, you may need to be hospitalized so you and your baby can be monitored closely. You may also need to take medicine (magnesium sulfate) to prevent seizures and to lower blood pressure. This medicine may be given as an injection or through an IV tube. °· In some cases, if your condition gets worse, you may need to deliver your baby early. ° °Follow these instructions at home: °Eating and drinking °· Drink enough fluid to keep your urine clear or pale yellow. °· Eat a healthy diet that is low in salt (sodium). Do not add salt to your food. Check food labels to see how much sodium a food or beverage contains. °Lifestyle °· Do not use any products that contain nicotine or tobacco, such as cigarettes and e-cigarettes. If you need help quitting, ask your health care provider. °· Do not use alcohol. °· Avoid caffeine. °· Avoid stress as much as possible. Rest and get plenty of sleep. °General instructions °· Take over-the-counter and prescription medicines only as told by your health care provider. °· While lying down, lie on your left side. This keeps pressure off your baby. °· While sitting or lying down, raise (elevate) your feet. Try putting some pillows under your lower legs. °· Exercise regularly. Ask your health care provider what kinds of exercise are best for you. °· Keep all prenatal and follow-up visits as told by your health care provider. This is important. °Contact a health care provider if: °· You have symptoms that your health care  provider told you may require more treatment or monitoring, such as: °? Fever. °? Vomiting. °? Headache. °Get help right away if: °· You have severe abdominal pain or vomiting that does not get better with treatment. °· You suddenly develop swelling in your hands, ankles, or face. °· You gain 4 lbs (1.8 kg) or more in 1 week. °· You develop vaginal bleeding, or you have blood in your urine. °· You do not feel your baby moving as much as usual. °· You have blurred or double vision. °· You have muscle twitching or sudden tightening (spasms). °· You have shortness of breath. °· Your lips or fingernails turn blue. °This information is not intended to replace advice given to you by your health care provider. Make sure you discuss any questions you have with your health care provider. °Document Released: 04/08/2011 Document Revised: 02/08/2016 Document Reviewed: 01/04/2016 °Elsevier Interactive Patient Education © 2018 [employer] ° °

## 2017-01-22 NOTE — Progress Notes (Signed)
Patient is in the office for follow up BP check.

## 2017-01-23 ENCOUNTER — Telehealth (HOSPITAL_COMMUNITY): Payer: Self-pay | Admitting: *Deleted

## 2017-01-23 LAB — CREATININE CLEARANCE, URINE, 24 HOUR
Creatinine, Ser: 0.49 mg/dL — ABNORMAL LOW (ref 0.57–1.00)
Creatinine, Urine: 92.6 mg/dL
GFR calc Af Amer: 157 mL/min/{1.73_m2} (ref >59)
GFR calc non Af Amer: 136 mL/min/{1.73_m2} (ref >59)

## 2017-01-23 LAB — PROTEIN, URINE, 24 HOUR: PROTEIN UR: 16.1 mg/dL

## 2017-01-23 NOTE — Telephone Encounter (Signed)
Preadmission screen  

## 2017-01-27 ENCOUNTER — Other Ambulatory Visit: Payer: Self-pay | Admitting: Certified Nurse Midwife

## 2017-01-28 ENCOUNTER — Ambulatory Visit (HOSPITAL_COMMUNITY)
Admission: RE | Admit: 2017-01-28 | Discharge: 2017-01-28 | Disposition: A | Discharge status: Home / Self Care | Payer: Medicaid Other | Source: Ambulatory Visit | Attending: Obstetrics & Gynecology | Admitting: Obstetrics & Gynecology

## 2017-01-28 ENCOUNTER — Encounter: Payer: Self-pay | Admitting: Certified Nurse Midwife

## 2017-01-28 ENCOUNTER — Other Ambulatory Visit (HOSPITAL_COMMUNITY)
Admission: RE | Admit: 2017-01-28 | Discharge: 2017-01-28 | Disposition: A | Discharge status: Home / Self Care | Payer: Medicaid Other | Source: Ambulatory Visit | Attending: Certified Nurse Midwife | Admitting: Certified Nurse Midwife

## 2017-01-28 ENCOUNTER — Ambulatory Visit (INDEPENDENT_AMBULATORY_CARE_PROVIDER_SITE_OTHER): Payer: BLUE CROSS/BLUE SHIELD | Admitting: Certified Nurse Midwife

## 2017-01-28 VITALS — BP 131/91 | HR 85 | Wt 264.6 lb

## 2017-01-28 DIAGNOSIS — Z6841 Body Mass Index (BMI) 40.0 and over, adult: Secondary | ICD-10-CM | POA: Insufficient documentation

## 2017-01-28 DIAGNOSIS — Z3A35 35 weeks gestation of pregnancy: Secondary | ICD-10-CM | POA: Insufficient documentation

## 2017-01-28 DIAGNOSIS — O0993 Supervision of high risk pregnancy, unspecified, third trimester: Secondary | ICD-10-CM | POA: Insufficient documentation

## 2017-01-28 DIAGNOSIS — O34219 Maternal care for unspecified type scar from previous cesarean delivery: Secondary | ICD-10-CM | POA: Insufficient documentation

## 2017-01-28 DIAGNOSIS — E669 Obesity, unspecified: Secondary | ICD-10-CM | POA: Insufficient documentation

## 2017-01-28 DIAGNOSIS — O99213 Obesity complicating pregnancy, third trimester: Secondary | ICD-10-CM | POA: Diagnosis not present

## 2017-01-28 DIAGNOSIS — O133 Gestational [pregnancy-induced] hypertension without significant proteinuria, third trimester: Secondary | ICD-10-CM

## 2017-01-28 DIAGNOSIS — Z98891 History of uterine scar from previous surgery: Secondary | ICD-10-CM

## 2017-01-28 NOTE — Progress Notes (Signed)
   PRENATAL VISIT NOTE  Subjective:  Caitlyn Galvan is a 26 y.o. G3P1011 at 279w5d being seen today for ongoing prenatal care.  She is currently monitored for the following issues for this high-risk pregnancy and has Severe obesity (BMI >= 40) (HCC); Supervision of high-risk pregnancy; H/O: cesarean section; Current maternal condition affecting pregnancy; and Gestational hypertension without significant proteinuria in third trimester on her problem list.  Patient reports no complaints.  Contractions: Not present. Vag. Bleeding: None.  Movement: Present. Denies leaking of fluid.   The following portions of the patient's history were reviewed and updated as appropriate: allergies, current medications, past family history, past medical history, past social history, past surgical history and problem list. Problem list updated.  Objective:   Vitals:   01/28/17 1458  BP: (!) 131/91  Pulse: 85  Weight: 264 lb 9.6 oz (120 kg)    Fetal Status: Fetal Heart Rate (bpm): NST Fundal Height: 38 cm Movement: Present  Presentation: Undeterminable  General:  Alert, oriented and cooperative. Patient is in no acute distress.  Skin: Skin is warm and dry. No rash noted.   Cardiovascular: Normal heart rate noted  Respiratory: Normal respiratory effort, no problems with respiration noted  Abdomen: Soft, gravid, appropriate for gestational age. Pain/Pressure: Present     Pelvic:  Cervical exam performed Dilation: Closed Effacement (%): 0 Station: Ballotable  Extremities: Normal range of motion.  Edema: Mild pitting, slight indentation  Mental Status: Normal mood and affect. Normal behavior. Normal judgment and thought content.  NST: + accels, no decels, moderate variability, Cat. 1 tracing. No contractions on toco.   Assessment and Plan:  Pregnancy: G3P1011 at 779w5d  1. Supervision of high risk pregnancy in third trimester     Reactive NST.   - Strep Gp B NAA - Cervicovaginal ancillary only - US  MFM FETAL BPP W/NONSTRESS; Future  2. Severe obesity (BMI >= 40) (HCC)      36 lb weight gain this pregnancy  3. H/O: cesarean section     TOLAC on File.   4. Gestational hypertension without significant proteinuria in third trimester     IOL scheduled for 37 weeks.  - US MFM FETAL BPP W/NONSTRESS; Future  Preterm labor symptoms and general obstetric precautions including but not limited to vaginal bleeding, contractions, leaking of fluid and fetal movement were reviewed in detail with the patient. Please refer to After Visit Summary for other counseling recommendations.  Return in about 1 week (around 02/04/2017) [facility].   Roe Coombsachelle A Raziyah Vanvleck, CNM

## 2017-01-29 LAB — CERVICOVAGINAL ANCILLARY ONLY
BACTERIAL VAGINITIS: NEGATIVE
Candida vaginitis: NEGATIVE
Chlamydia: NEGATIVE
Neisseria Gonorrhea: NEGATIVE
Trichomonas: NEGATIVE

## 2017-01-30 ENCOUNTER — Encounter: Payer: Self-pay | Admitting: Certified Nurse Midwife

## 2017-01-30 LAB — STREP GP B NAA: Strep Gp B NAA: POSITIVE — AB

## 2017-02-02 ENCOUNTER — Encounter (HOSPITAL_COMMUNITY): Payer: Self-pay | Admitting: *Deleted

## 2017-02-02 ENCOUNTER — Telehealth (HOSPITAL_COMMUNITY): Payer: Self-pay | Admitting: *Deleted

## 2017-02-02 NOTE — Telephone Encounter (Signed)
Preadmission screen  

## 2017-02-03 ENCOUNTER — Ambulatory Visit (HOSPITAL_COMMUNITY)
Admission: RE | Admit: 2017-02-03 | Discharge: 2017-02-03 | Disposition: A | Discharge status: Home / Self Care | Payer: Medicaid Other | Source: Ambulatory Visit | Attending: Certified Nurse Midwife | Admitting: Certified Nurse Midwife

## 2017-02-03 ENCOUNTER — Other Ambulatory Visit: Payer: Self-pay | Admitting: Certified Nurse Midwife

## 2017-02-03 DIAGNOSIS — O133 Gestational [pregnancy-induced] hypertension without significant proteinuria, third trimester: Secondary | ICD-10-CM | POA: Insufficient documentation

## 2017-02-03 DIAGNOSIS — O0993 Supervision of high risk pregnancy, unspecified, third trimester: Secondary | ICD-10-CM

## 2017-02-03 DIAGNOSIS — Z3A36 36 weeks gestation of pregnancy: Secondary | ICD-10-CM | POA: Insufficient documentation

## 2017-02-05 ENCOUNTER — Ambulatory Visit (INDEPENDENT_AMBULATORY_CARE_PROVIDER_SITE_OTHER): Payer: BLUE CROSS/BLUE SHIELD | Admitting: Obstetrics & Gynecology

## 2017-02-05 VITALS — BP 144/94 | HR 99 | Wt 274.0 lb

## 2017-02-05 DIAGNOSIS — O133 Gestational [pregnancy-induced] hypertension without significant proteinuria, third trimester: Secondary | ICD-10-CM

## 2017-02-05 DIAGNOSIS — O0993 Supervision of high risk pregnancy, unspecified, third trimester: Secondary | ICD-10-CM

## 2017-02-05 NOTE — Patient Instructions (Signed)
Labor Induction Labor induction is when steps are taken to cause a pregnant woman to begin the labor process. Most women go into labor on their own between 37 weeks and 42 weeks of the pregnancy. When this does not happen or when there is a medical need, methods may be used to induce labor. Labor induction causes a pregnant woman's uterus to contract. It also causes the cervix to soften (ripen), open (dilate), and thin out (efface). Usually, labor is not induced before 39 weeks of the pregnancy unless there is a problem with the baby or mother. Before inducing labor, your health care provider will consider a number of factors, including the following:  The medical condition of you and the baby.  How many weeks along you are.  The status of the baby's lung maturity.  The condition of the cervix.  The position of the baby. What are the reasons for labor induction? Labor may be induced for the following reasons:  The health of the baby or mother is at risk.  The pregnancy is overdue by 1 week or more.  The water breaks but labor does not start on its own.  The mother has a health condition or serious illness, such as high blood pressure, infection, placental abruption, or diabetes.  The amniotic fluid amounts are low around the baby.  The baby is distressed. Convenience or wanting the baby to be born on a certain date is not a reason for inducing labor. What methods are used for labor induction? Several methods of labor induction may be used, such as:  Prostaglandin medicine. This medicine causes the cervix to dilate and ripen. The medicine will also start contractions. It can be taken by mouth or by inserting a suppository into the vagina.  Inserting a thin tube (catheter) with a balloon on the end into the vagina to dilate the cervix. Once inserted, the balloon is expanded with water, which causes the cervix to open.  Stripping the membranes. Your health care provider separates  amniotic sac tissue from the cervix, causing the cervix to be stretched and causing the release of a hormone called progesterone. This may cause the uterus to contract. It is often done during an office visit. You will be sent home to wait for the contractions to begin. You will then come in for an induction.  Breaking the water. Your health care provider makes a hole in the amniotic sac using a small instrument. Once the amniotic sac breaks, contractions should begin. This may still take hours to see an effect.  Medicine to trigger or strengthen contractions. This medicine is given through an IV access tube inserted into a vein in your arm. All of the methods of induction, besides stripping the membranes, will be done in the hospital. Induction is done in the hospital so that you and the baby can be carefully monitored. How long does it take for labor to be induced? Some inductions can take up to 2-3 days. Depending on the cervix, it usually takes less time. It takes longer when you are induced early in the pregnancy or if this is your first pregnancy. If a mother is still pregnant and the induction has been going on for 2-3 days, either the mother will be sent home or a cesarean delivery will be needed. What are the risks associated with labor induction? Some of the risks of induction include:  Changes in fetal heart rate, such as too high, too low, or erratic.  Fetal distress.    Chance of infection for the mother and baby.  Increased chance of having a cesarean delivery.  Breaking off (abruption) of the placenta from the uterus (rare).  Uterine rupture (very rare). When induction is needed for medical reasons, the benefits of induction may outweigh the risks. What are some reasons for not inducing labor? Labor induction should not be done if:  It is shown that your baby does not tolerate labor.  You have had previous surgeries on your uterus, such as a myomectomy or the removal of  fibroids.  Your placenta lies very low in the uterus and blocks the opening of the cervix (placenta previa).  Your baby is not in a head-down position.  The umbilical cord drops down into the birth canal in front of the baby. This could cut off the baby's blood and oxygen supply.  You have had a previous cesarean delivery.  There are unusual circumstances, such as the baby being extremely premature. This information is not intended to replace advice given to you by your health care provider. Make sure you discuss any questions you have with your health care provider. Document Released: 12/10/2006 Document Revised: 12/27/2015 Document Reviewed: 02/17/2013 Elsevier Interactive Patient Education  2017 Elsevier Inc.  

## 2017-02-05 NOTE — Progress Notes (Signed)
   PRENATAL VISIT NOTE  Subjective:  Caitlyn DickerMarissa R Galvan is a 26 y.o. G3P1011 at 2073w6d being seen today for ongoing prenatal care.  She is currently monitored for the following issues for this high-risk pregnancy and has Severe obesity (BMI >= 40) (HCC); Supervision of high-risk pregnancy; H/O: cesarean section; Current maternal condition affecting pregnancy; and Gestational hypertension without significant proteinuria in third trimester on her problem list.  Patient reports no complaints.  Contractions: Not present. Vag. Bleeding: None.  Movement: Present. Denies leaking of fluid.   The following portions of the patient's history were reviewed and updated as appropriate: allergies, current medications, past family history, past medical history, past social history, past surgical history and problem list. Problem list updated.  Objective:   Vitals:   02/05/17 0959  BP: (!) 144/94  Pulse: 99  Weight: 274 lb (124.3 kg)    Fetal Status: Fetal Heart Rate (bpm): nst   Movement: Present     General:  Alert, oriented and cooperative. Patient is in no acute distress.  Skin: Skin is warm and dry. No rash noted.   Cardiovascular: Normal heart rate noted  Respiratory: Normal respiratory effort, no problems with respiration noted  Abdomen: Soft, gravid, appropriate for gestational age. Pain/Pressure: Present     Pelvic:  Cervical exam deferred        Extremities: Normal range of motion.  Edema: Mild pitting, slight indentation  Mental Status: Normal mood and affect. Normal behavior. Normal judgment and thought content.   Assessment and Plan:  Pregnancy: G3P1011 at 5273w6d  1. Supervision of high risk pregnancy in third trimester NST is reactive today  2. Gestational hypertension without significant proteinuria in third trimester IOL tomorrow - Fetal nonstress test; Future  Preterm labor symptoms and general obstetric precautions including but not limited to vaginal bleeding, contractions,  leaking of fluid and fetal movement were reviewed in detail with the patient. Please refer to After Visit Summary for other counseling recommendations.  Return if symptoms worsen or fail to improve, for postpartum.   Scheryl DarterJames Micheal Sheen, MD

## 2017-02-05 NOTE — Progress Notes (Signed)
Pt presents for ROB with swelling in feet and elevated BP today. Pt has HA's but states nothing out of the norm. Pt IOL is tomorrow.

## 2017-02-06 ENCOUNTER — Inpatient Hospital Stay (HOSPITAL_COMMUNITY)
Admission: AD | Admit: 2017-02-06 | Discharge: 2017-02-10 | DRG: 765 | Disposition: A | Discharge status: Home / Self Care | Payer: Medicaid Other | Source: Ambulatory Visit | Attending: Obstetrics & Gynecology | Admitting: Obstetrics & Gynecology

## 2017-02-06 ENCOUNTER — Encounter (HOSPITAL_COMMUNITY): Payer: Self-pay

## 2017-02-06 DIAGNOSIS — O411230 Chorioamnionitis, third trimester, not applicable or unspecified: Secondary | ICD-10-CM | POA: Diagnosis present

## 2017-02-06 DIAGNOSIS — O1205 Gestational edema, complicating the puerperium: Secondary | ICD-10-CM | POA: Diagnosis present

## 2017-02-06 DIAGNOSIS — O99824 Streptococcus B carrier state complicating childbirth: Secondary | ICD-10-CM | POA: Diagnosis present

## 2017-02-06 DIAGNOSIS — O133 Gestational [pregnancy-induced] hypertension without significant proteinuria, third trimester: Secondary | ICD-10-CM

## 2017-02-06 DIAGNOSIS — O99214 Obesity complicating childbirth: Secondary | ICD-10-CM | POA: Diagnosis present

## 2017-02-06 DIAGNOSIS — Z6841 Body Mass Index (BMI) 40.0 and over, adult: Secondary | ICD-10-CM

## 2017-02-06 DIAGNOSIS — Z3A37 37 weeks gestation of pregnancy: Secondary | ICD-10-CM

## 2017-02-06 DIAGNOSIS — Z87891 Personal history of nicotine dependence: Secondary | ICD-10-CM

## 2017-02-06 DIAGNOSIS — O134 Gestational [pregnancy-induced] hypertension without significant proteinuria, complicating childbirth: Secondary | ICD-10-CM | POA: Diagnosis present

## 2017-02-06 DIAGNOSIS — O34211 Maternal care for low transverse scar from previous cesarean delivery: Secondary | ICD-10-CM | POA: Diagnosis present

## 2017-02-06 DIAGNOSIS — D649 Anemia, unspecified: Secondary | ICD-10-CM | POA: Diagnosis present

## 2017-02-06 DIAGNOSIS — O9902 Anemia complicating childbirth: Secondary | ICD-10-CM | POA: Diagnosis present

## 2017-02-06 HISTORY — DX: Gestational (pregnancy-induced) hypertension without significant proteinuria, unspecified trimester: O13.9

## 2017-02-06 LAB — CBC
HEMATOCRIT: 34.9 % — AB (ref 36.0–46.0)
HEMOGLOBIN: 11.6 g/dL — AB (ref 12.0–15.0)
MCH: 28.3 pg (ref 26.0–34.0)
MCHC: 33.2 g/dL (ref 30.0–36.0)
MCV: 85.1 fL (ref 78.0–100.0)
Platelets: 295 10*3/uL (ref 150–400)
RBC: 4.1 MIL/uL (ref 3.87–5.11)
RDW: 15 % (ref 11.5–15.5)
WBC: 10.6 10*3/uL — AB (ref 4.0–10.5)

## 2017-02-06 LAB — RPR: RPR Ser Ql: NONREACTIVE

## 2017-02-06 LAB — TYPE AND SCREEN
ABO/RH(D): A POS
Antibody Screen: NEGATIVE

## 2017-02-06 LAB — ABO/RH: ABO/RH(D): A POS

## 2017-02-06 MED ORDER — LACTATED RINGERS IV SOLN
500.0000 mL | INTRAVENOUS | Status: DC | PRN
Start: 2017-02-06 — End: 2017-02-08

## 2017-02-06 MED ORDER — ONDANSETRON HCL 4 MG/2ML IJ SOLN
4.0000 mg | Freq: Four times a day (QID) | INTRAMUSCULAR | Status: DC | PRN
  Administered 2017-02-07 – 2017-02-08 (×3): 4 mg via INTRAVENOUS
  Filled 2017-02-06 (×2): qty 2

## 2017-02-06 MED ORDER — OXYTOCIN BOLUS FROM INFUSION: 500.0000 mL | Freq: Once | INTRAVENOUS | Status: DC

## 2017-02-06 MED ORDER — SOD CITRATE-CITRIC ACID 500-334 MG/5ML PO SOLN
30.0000 mL | ORAL | Status: DC | PRN
Start: 2017-02-06 — End: 2017-02-08
  Administered 2017-02-08: 30 mL via ORAL
  Filled 2017-02-06: qty 15

## 2017-02-06 MED ORDER — LACTATED RINGERS IV SOLN
INTRAVENOUS | Status: DC
  Administered 2017-02-06: 125 mL/h via INTRAVENOUS
  Administered 2017-02-07 – 2017-02-08 (×7): via INTRAVENOUS

## 2017-02-06 MED ORDER — FLEET ENEMA 7-19 GM/118ML RE ENEM: 1.0000 | ENEMA | RECTAL | Status: DC | PRN

## 2017-02-06 MED ORDER — PENICILLIN G POT IN DEXTROSE 60000 UNIT/ML IV SOLN
3.0000 10*6.[IU] | INTRAVENOUS | Status: DC
  Administered 2017-02-06 – 2017-02-08 (×10): 3 10*6.[IU] via INTRAVENOUS
  Filled 2017-02-06 (×13): qty 50

## 2017-02-06 MED ORDER — PENICILLIN G POTASSIUM 5000000 UNITS IJ SOLR
5.0000 10*6.[IU] | Freq: Once | INTRAVENOUS | Status: AC
  Administered 2017-02-06: 5 10*6.[IU] via INTRAVENOUS
  Filled 2017-02-06: qty 5

## 2017-02-06 MED ORDER — LIDOCAINE HCL (PF) 1 % IJ SOLN: 30.0000 mL | INTRAMUSCULAR | Status: DC | PRN

## 2017-02-06 MED ORDER — TERBUTALINE SULFATE 1 MG/ML IJ SOLN: 0.2500 mg | Freq: Once | INTRAMUSCULAR | Status: DC | PRN

## 2017-02-06 MED ORDER — OXYTOCIN 40 UNITS IN LACTATED RINGERS INFUSION - SIMPLE MED: 2.5000 [IU]/h | INTRAVENOUS | Status: DC

## 2017-02-06 MED ORDER — OXYTOCIN 40 UNITS IN LACTATED RINGERS INFUSION - SIMPLE MED
1.0000 m[IU]/min | INTRAVENOUS | Status: DC
  Administered 2017-02-06: 2 m[IU]/min via INTRAVENOUS
  Administered 2017-02-07: 10 m[IU]/min via INTRAVENOUS
  Filled 2017-02-06: qty 1000

## 2017-02-06 MED ORDER — OXYCODONE-ACETAMINOPHEN 5-325 MG PO TABS: 1.0000 | ORAL_TABLET | ORAL | Status: DC | PRN

## 2017-02-06 MED ORDER — ACETAMINOPHEN 325 MG PO TABS
650.0000 mg | ORAL_TABLET | ORAL | Status: DC | PRN
Start: 2017-02-06 — End: 2017-02-08

## 2017-02-06 MED ORDER — OXYCODONE-ACETAMINOPHEN 5-325 MG PO TABS
2.0000 | ORAL_TABLET | ORAL | Status: DC | PRN
Start: 2017-02-06 — End: 2017-02-08

## 2017-02-06 MED ORDER — FENTANYL CITRATE (PF) 100 MCG/2ML IJ SOLN
100.0000 ug | INTRAMUSCULAR | Status: DC | PRN
  Administered 2017-02-07 (×4): 100 ug via INTRAVENOUS
  Filled 2017-02-06 (×4): qty 2

## 2017-02-06 NOTE — Progress Notes (Signed)
   Caitlyn DickerMarissa R Galvan is a 26 y.o. G3P1011 at 6929w0d  admitted for induction of labor due to gestational HTN. Patient has h/o of C-section for FTP ([facility]  Subjective: Patient doing well, no complaints.   Objective: Vitals:   02/06/17 0821 02/06/17 0822 02/06/17 0846 02/06/17 0939  BP:  132/78 (!) 141/83 132/88  Pulse:  96 92 87  Resp:  18 18 18   Temp:  98.1 F (36.7 C)    TempSrc:  Oral    Weight: 274 lb (124.3 kg)     Height: 5\' 3"  (1.6 m)      No intake/output data recorded.  FHT:  FHR: 150 bpm, variability: moderate,  accelerations:  Present,  decelerations:  Absent UC:   irregular, every 1-2 minutes; lasting less than 30 seconds SVE:   Dilation: 1 Effacement (%): 50 Station: -3 Exam by:: Kooistra Pitocin @ 4 mu/min  Labs: Lab Results  Component Value Date   WBC 10.6 (H) 02/06/2017   HGB 11.6 (L) 02/06/2017   HCT 34.9 (L) 02/06/2017   MCV 85.1 02/06/2017   PLT 295 02/06/2017    Assessment / Plan: G3P1011 at 37 weeks and 3 days here IOL for gestationla hyptersion. BP normal at this time.   Cervix is fingertip but stretchable to 1 cm with effort; at this time will defer FB until patient reaches 2 cm. Patient understands plan of care, all questions answered.   Labor: Progressing on Pitocin, will continue to increase then AROM Fetal Wellbeing:  Category I Pain Control:  Labor support without medications Anticipated MOD:  NSVD  Charlesetta GaribaldiKathryn Lorraine Kooistra CNM 02/06/2017, 10:07 AM

## 2017-02-06 NOTE — Anesthesia Pain Management Evaluation Note (Signed)
  [facility] Pain Management Visit Note  Patient: Caitlyn Galvan, 26 y.o., female  "[facility] We have an anesthesia team available at all times to provide care throughout the hospital, including epidural management and anesthesia for C-section. I don't know your plan for the delivery whether it a natural birth, water birth, IV sedation, nitrous supplementation, doula or epidural, but we want to meet your pain goals."   1.Was your pain managed to your expectations on prior hospitalizations?   Yes   2.What is your expectation for pain management during this hospitalization?     Epidural  3.How can we help you reach that goal? epidural  Record the patient's initial score and the patient's pain goal.   Pain: 0  Pain Goal: 6 [facility] wants you to be able to say your pain was always managed very well.  [name] 02/06/2017

## 2017-02-06 NOTE — H&P (Signed)
LABOR AND DELIVERY ADMISSION HISTORY AND PHYSICAL NOTE  Caitlyn Galvan is a 26 y.o. female 893P1011 with IUP at 4564w0d by LMP consistent with 18 week ultrasound presenting for IOL for gHTN. She reports +FM, + contractions, No LOF, no VB, no blurry vision, peripheral edema, and RUQ pain. Patient states she has a history of headaches but does not currently have a headache. She plans on breast feeding. She request pills for birth control.   Prenatal History/Complications:  Past Medical History: Past Medical History:  Diagnosis Date  . Anxiety   . Depression   . Headache   . No pertinent past medical history     Past Surgical History: Past Surgical History:  Procedure Laterality Date  . ADENOIDECTOMY    . adnoids    . CESAREAN SECTION    . DILATION AND CURETTAGE OF UTERUS    . DILATION AND EVACUATION N/A 07/19/2015   Procedure: DILATATION AND EVACUATION;  Surgeon: Mitchel HonourMegan Morris, DO;  Location: [facility];  Service: Gynecology;  Laterality: N/A;  . INCISE AND DRAIN ABCESS    . tubes in ears    . WISDOM TOOTH EXTRACTION      Obstetrical History: OB History    Gravida Para Term Preterm AB Living   3 1 1  0 1 1   SAB TAB Ectopic Multiple Live Births   1 0 0 0        Social History: Social History   Social History  . Marital status: Single    Spouse name: N/A  . Number of children: N/A  . Years of education: N/A   Social History Main Topics  . Smoking status: Former Smoker    Packs/day: 0.50    Years: 2.00    Types: Cigarettes, E-cigarettes    Quit date: 06/16/2014  . Smokeless tobacco: Former NeurosurgeonUser     Comment: 1/2 pack a week  . Alcohol use 0.0 oz/week     Comment: occasionally; not while preg  . Drug use: No     Comment: oct 2017  . Sexual activity: Yes    Partners: Male    Birth control/ protection: None   Other Topics Concern  . Not on file   Social History Narrative  . No narrative on file    Family History: Family History  Problem Relation Age of  Onset  . Depression Mother   . Diabetes Mother   . Hypertension Father   . Stroke Father   . Asthma Sister   . Depression Sister   . Hypertension Sister   . Mental retardation Sister   . Arthritis Maternal Aunt   . Asthma Maternal Aunt   . Diabetes Paternal Grandmother   . Alcohol abuse Neg Hx   . Birth defects Neg Hx   . Cancer Neg Hx   . COPD Neg Hx   . Drug abuse Neg Hx   . Early death Neg Hx   . Hearing loss Neg Hx   . Hyperlipidemia Neg Hx   . Heart disease Neg Hx   . Kidney disease Neg Hx   . Learning disabilities Neg Hx   . Mental illness Neg Hx   . Miscarriages / Stillbirths Neg Hx   . Vision loss Neg Hx   . Varicose Veins Neg Hx     Allergies: No Known Allergies  Prescriptions Prior to Admission  Medication Sig Dispense Refill Last Dose  . acetaminophen (TYLENOL) 650 MG CR tablet Take 650 mg by mouth every 8 (eight)  hours as needed for pain.   Taking  . Butalbital-APAP-Caffeine 50-300-40 MG CAPS Take 1 capsule by mouth 2 (two) times daily as needed (for headache).   Not Taking  . Elastic Bandages & Supports (COMFORT FIT MATERNITY SUPP LG) MISC 1 Units by Does not apply route daily. 1 each 0 Taking  . Prenat-FeAsp-Meth-FA-DHA w/o A (PRENATE PIXIE) 10-0.6-0.4-200 MG CAPS Take 1 capsule by mouth daily.   Taking  . progesterone (PROMETRIUM) 200 MG capsule Place 200 mg vaginally at bedtime.   Taking  . ranitidine (ZANTAC) 150 MG tablet Take 1 tablet (150 mg total) by mouth 2 (two) times daily. 60 tablet 5 Taking     Review of Systems   All systems reviewed and negative except as stated in HPI  Physical Exam Last menstrual period 05/23/2016, unknown if currently breastfeeding. General appearance: alert, cooperative and no distress  Presentation: cephalic FHT: 150 bpm, mod var, +accels, no decels Uterine activity: minimal   Prenatal labs: ABO, Rh: A/Positive/-- (01/16 1450) Antibody: Negative (01/16 1450) Rubella: immune RPR: Non Reactive (05/08 1036)   HBsAg: Negative (01/16 1450)  HIV: Non Reactive (05/08 1036)  GBS: Positive (06/27 1600)  1 hr Glucola: WNL Genetic screening:  WNL Anatomy US: normal at 18 weeks  Prenatal Transfer Tool  Maternal Diabetes: No Genetic Screening: Normal Maternal Ultrasounds/Referrals: Normal Fetal Ultrasounds or other Referrals:  None Maternal Substance Abuse:  No Significant Maternal Medications:  None Significant Maternal Lab Results: Lab values include: Group B Strep positive  No results found for this or any previous visit (from the past 24 hour(s)).  Patient Active Problem List   Diagnosis Date Noted  . Gestational hypertension without significant proteinuria 02/06/2017  . Gestational hypertension without significant proteinuria in third trimester 01/22/2017  . Current maternal condition affecting pregnancy 12/18/2016  . Supervision of high-risk pregnancy 08/19/2016  . H/O: cesarean section 08/19/2016  . Severe obesity (BMI >= 40) (HCC) 10/19/2014    Assessment: Caitlyn Galvan is a 26 y.o. G3P1011 at [redacted]w[redacted]d here for IOL for gHTN  #Labor: check and start pitocin for TOLAC; Plan on foley bulb.  #Pain: Epidural on request #FWB: Cat 1 #ID: GBS positive #MOF: breast #MOC:pills #Circ:  N/A  Caitlyn J Shirley, DO PGY-1 [facility] 9:12 AM 02/06/17    I confirm that I have verified the information documented in the resident's note and that I have also personally performed the physical exam and all medical decision making activities.  Caitlyn Galvan CNM

## 2017-02-06 NOTE — Progress Notes (Signed)
   Caitlyn Galvan is a 26 y.o. G3P1011 at 2170w0d  admitted for induction of labor due to Hypertension.  Subjective:  Patient coping well; eating icees in bed. No complaints at this time.  Objective: Vitals:   02/06/17 1730 02/06/17 1756 02/06/17 1800 02/06/17 1848  BP: 110/81  124/87   Pulse: 90  86   Resp:    (P) 18  Temp:  98.3 F (36.8 C)    TempSrc:  Oral    Weight:      Height:       No intake/output data recorded.  FHT:  FHR: 150 bpm, variability: moderate,  accelerations:  Present,  decelerations:  Absent UC:   irregular, every 2-3 minutes SVE:   Dilation: 1 Effacement (%): 50 Station: -3 Exam by:: Galvan Pitocin @ 7516mu/min  Labs: Lab Results  Component Value Date   WBC 10.6 (H) 02/06/2017   HGB 11.6 (L) 02/06/2017   HCT 34.9 (L) 02/06/2017   MCV 85.1 02/06/2017   PLT 295 02/06/2017    Assessment / Plan: Induction of labor due to ghtn,  progressing well on pitocin  Labor: early labor Fetal Wellbeing:  Category I Pain Control:  Labor support without medications Anticipated MOD:  NSVD  Caitlyn Galvan 02/06/2017, 6:56 PM

## 2017-02-06 NOTE — Progress Notes (Signed)
Patient ID: [name], female   DOB: [birth year], 26 y.o.   MRN: [id number] Doing well, having some cramping with contractions  Got to 5cm before C/S last time  . Vitals:   02/06/17 1930 02/06/17 2000 02/06/17 2037 02/06/17 2220  BP: 136/82 138/80 (!) 142/79 (!) 151/93  Pulse: 76 84 86 85  Resp: 18 18  18   Temp:    98.7 F (37.1 C)  TempSrc:    Oral  Weight:      Height:       FHR reactive UCs irregular  Dilation: 1 Effacement (%): 80 Cervical Position: Anterior Station: -3 Presentation: Vertex Exam by:: M.Soliana Kitko CNM  Foley inserted into cervix and inflated  Continue Pitocin

## 2017-02-07 ENCOUNTER — Inpatient Hospital Stay (HOSPITAL_COMMUNITY): Payer: Medicaid Other | Admitting: Anesthesiology

## 2017-02-07 LAB — CBC
HCT: 34 % — ABNORMAL LOW (ref 36.0–46.0)
HEMOGLOBIN: 11.2 g/dL — AB (ref 12.0–15.0)
MCH: 28.1 pg (ref 26.0–34.0)
MCHC: 32.9 g/dL (ref 30.0–36.0)
MCV: 85.4 fL (ref 78.0–100.0)
Platelets: 276 10*3/uL (ref 150–400)
RBC: 3.98 MIL/uL (ref 3.87–5.11)
RDW: 15.1 % (ref 11.5–15.5)
WBC: 14 10*3/uL — ABNORMAL HIGH (ref 4.0–10.5)

## 2017-02-07 MED ORDER — EPHEDRINE 5 MG/ML INJ: 10.0000 mg | INTRAVENOUS | Status: DC | PRN

## 2017-02-07 MED ORDER — FENTANYL 2.5 MCG/ML BUPIVACAINE 1/10 % EPIDURAL INFUSION (WH - ANES)
14.0000 mL/h | INTRAMUSCULAR | Status: DC | PRN
  Administered 2017-02-07 – 2017-02-08 (×3): 14 mL/h via EPIDURAL
  Filled 2017-02-07 (×3): qty 100

## 2017-02-07 MED ORDER — PHENYLEPHRINE 40 MCG/ML (10ML) SYRINGE FOR IV PUSH (FOR BLOOD PRESSURE SUPPORT): 80.0000 ug | PREFILLED_SYRINGE | INTRAVENOUS | Status: DC | PRN

## 2017-02-07 MED ORDER — DIPHENHYDRAMINE HCL 50 MG/ML IJ SOLN: 12.5000 mg | INTRAMUSCULAR | Status: DC | PRN

## 2017-02-07 MED ORDER — LACTATED RINGERS IV SOLN
500.0000 mL | Freq: Once | INTRAVENOUS | Status: AC
  Administered 2017-02-07: 500 mL via INTRAVENOUS

## 2017-02-07 MED ORDER — LACTATED RINGERS IV SOLN: 500.0000 mL | Freq: Once | INTRAVENOUS | Status: DC

## 2017-02-07 MED ORDER — PHENYLEPHRINE 40 MCG/ML (10ML) SYRINGE FOR IV PUSH (FOR BLOOD PRESSURE SUPPORT)
80.0000 ug | PREFILLED_SYRINGE | INTRAVENOUS | Status: DC | PRN
  Filled 2017-02-07 (×2): qty 10

## 2017-02-07 MED ORDER — LIDOCAINE HCL (PF) 1 % IJ SOLN
INTRAMUSCULAR | Status: DC | PRN
  Administered 2017-02-07 (×2): 5 mL via EPIDURAL

## 2017-02-07 NOTE — Progress Notes (Signed)
Patient seen and doing well. Comfortable with epidural.  Patient has been on pit for approx 36 hours. Will start pit break at this time for 2 hours. Restart at 10PM. Plan to place IUPC at that time.   Patient has history of c-section for arrest of descent at 5cm. Will continue to monitor for progress.  Patient is ruptures no signs of tripple I  Category 1 tracing  Caitlyn PennaNicholas Schenk, MD 02/07/17 8:09 PM

## 2017-02-07 NOTE — Anesthesia Preprocedure Evaluation (Signed)
Anesthesia Evaluation  Patient identified by MRN, date of birth, ID band Patient awake    Reviewed: Allergy & Precautions, H&P , NPO status , Patient's Chart, lab work & pertinent test results  Airway Mallampati: II   Neck ROM: full    Dental   Pulmonary former smoker,    breath sounds clear to auscultation       Cardiovascular hypertension,  Rhythm:regular Rate:Normal     Neuro/Psych  Headaches, PSYCHIATRIC DISORDERS Anxiety Depression    GI/Hepatic   Endo/Other  Morbid obesity  Renal/GU      Musculoskeletal   Abdominal   Peds  Hematology   Anesthesia Other Findings   Reproductive/Obstetrics (+) Pregnancy                             Anesthesia Physical Anesthesia Plan  ASA: II  Anesthesia Plan: Epidural   Post-op Pain Management:    Induction: Intravenous  PONV Risk Score and Plan: 2 and Treatment may vary due to age or medical condition  Airway Management Planned: Natural Airway  Additional Equipment:   Intra-op Plan:   Post-operative Plan:   Informed Consent: I have reviewed the patients History and Physical, chart, labs and discussed the procedure including the risks, benefits and alternatives for the proposed anesthesia with the patient or authorized representative who has indicated his/her understanding and acceptance.     Plan Discussed with: CRNA, Anesthesiologist and Surgeon  Anesthesia Plan Comments:         Anesthesia Quick Evaluation

## 2017-02-07 NOTE — Progress Notes (Signed)
Patient seen and doing well. No complaints. Cervix 4/60/-3. AROM for clear fluid. Category 1 tracing.  Ernestina PennaNicholas Schenk, MD 02/07/17 1:33 PM

## 2017-02-07 NOTE — Progress Notes (Signed)
Labor Progress Note  Caitlyn Galvan is a 26 y.o. G3P1011 at 6541w1d  admitted for IOL for gHTN, VBAC  O:  BP 135/90   Pulse 78   Temp 98.8 F (37.1 C) (Oral)   Resp 18   Ht 5\' 3"  (1.6 m)   Wt 124.3 kg (274 lb)   LMP 05/23/2016 (Approximate)   BMI 48.54 kg/m   No intake/output data recorded.  FHT:  FHR: 130 bpm, variability: moderate,  accelerations:  Present,  decelerations:  Absent UC:  Irregular  SVE:   Dilation: 1 Effacement (%): 80 Station: -3 Exam by:: M.Williams CNM  Pitocin @ 16 mu/min  Labs: Lab Results  Component Value Date   WBC 10.6 (H) 02/06/2017   HGB 11.6 (L) 02/06/2017   HCT 34.9 (L) 02/06/2017   MCV 85.1 02/06/2017   PLT 295 02/06/2017    Assessment / Plan: Labor: Progressing on Pitocin, will continue to increase then AROM Fetal Wellbeing:  Category I Pain Control:  IV pain meds , planning for epidural Anticipated MOD:  NSVD  Expectant management  Caitlyn PouchLauren Cleatis Fandrich, MD PGY-2 [facility]

## 2017-02-07 NOTE — Anesthesia Procedure Notes (Signed)
Epidural Patient location during procedure: OB Start time: 02/07/2017 2:31 PM End time: 02/07/2017 2:38 PM  Staffing Anesthesiologist: Chaney MallingHODIERNE, Olly Shiner Performed: anesthesiologist   Preanesthetic Checklist Completed: patient identified, site marked, pre-op evaluation, timeout performed, IV checked, risks and benefits discussed and monitors and equipment checked  Epidural Patient position: sitting Prep: DuraPrep Patient monitoring: heart rate, cardiac monitor, continuous pulse ox and blood pressure Approach: midline Location: L2-L3 Injection technique: LOR saline  Needle:  Needle type: Tuohy  Needle gauge: 17 G Needle length: 9 cm Needle insertion depth: 7 cm Catheter type: closed end flexible Catheter size: 19 Gauge Catheter at skin depth: 12 cm Test dose: negative and Other  Assessment Events: blood not aspirated, injection not painful, no injection resistance and negative IV test  Additional Notes Informed consent obtained prior to proceeding including risk of failure, 1% risk of PDPH, risk of minor discomfort and bruising.  Discussed rare but serious complications including epidural abscess, permanent nerve injury, epidural hematoma.  Discussed alternatives to epidural analgesia and patient desires to proceed.  Timeout performed pre-procedure verifying patient name, procedure, and platelet count.  Patient tolerated procedure well. Reason for block:procedure for pain

## 2017-02-08 ENCOUNTER — Encounter (HOSPITAL_COMMUNITY)
Admission: AD | Disposition: A | Discharge status: Home / Self Care | Payer: Self-pay | Source: Ambulatory Visit | Attending: Obstetrics & Gynecology

## 2017-02-08 ENCOUNTER — Encounter (HOSPITAL_COMMUNITY): Payer: Self-pay

## 2017-02-08 DIAGNOSIS — O99824 Streptococcus B carrier state complicating childbirth: Secondary | ICD-10-CM

## 2017-02-08 DIAGNOSIS — O134 Gestational [pregnancy-induced] hypertension without significant proteinuria, complicating childbirth: Secondary | ICD-10-CM

## 2017-02-08 DIAGNOSIS — O34211 Maternal care for low transverse scar from previous cesarean delivery: Secondary | ICD-10-CM

## 2017-02-08 DIAGNOSIS — Z3A37 37 weeks gestation of pregnancy: Secondary | ICD-10-CM

## 2017-02-08 LAB — CBC
HEMATOCRIT: 29 % — AB (ref 36.0–46.0)
HEMOGLOBIN: 9.5 g/dL — AB (ref 12.0–15.0)
MCH: 27.9 pg (ref 26.0–34.0)
MCHC: 32.8 g/dL (ref 30.0–36.0)
MCV: 85.3 fL (ref 78.0–100.0)
Platelets: 262 10*3/uL (ref 150–400)
RBC: 3.4 MIL/uL — ABNORMAL LOW (ref 3.87–5.11)
RDW: 15 % (ref 11.5–15.5)
WBC: 16.7 10*3/uL — ABNORMAL HIGH (ref 4.0–10.5)

## 2017-02-08 SURGERY — Surgical Case
Anesthesia: Epidural

## 2017-02-08 MED ORDER — LACTATED RINGERS IV SOLN: INTRAVENOUS | Status: DC

## 2017-02-08 MED ORDER — OXYTOCIN 10 UNIT/ML IJ SOLN
INTRAMUSCULAR | Status: AC
  Filled 2017-02-08: qty 4

## 2017-02-08 MED ORDER — MEPERIDINE HCL 25 MG/ML IJ SOLN
INTRAMUSCULAR | Status: DC | PRN
  Administered 2017-02-08: 12.5 mg via INTRAVENOUS

## 2017-02-08 MED ORDER — PIPERACILLIN-TAZOBACTAM 3.375 G IVPB
3.3750 g | Freq: Three times a day (TID) | INTRAVENOUS | Status: AC
  Administered 2017-02-09 (×2): 3.375 g via INTRAVENOUS
  Filled 2017-02-08 (×2): qty 50

## 2017-02-08 MED ORDER — MORPHINE SULFATE (PF) 0.5 MG/ML IJ SOLN
INTRAMUSCULAR | Status: AC
  Filled 2017-02-08: qty 10

## 2017-02-08 MED ORDER — SIMETHICONE 80 MG PO CHEW
80.0000 mg | CHEWABLE_TABLET | ORAL | Status: DC
  Administered 2017-02-09 (×2): 80 mg via ORAL
  Filled 2017-02-08 (×3): qty 1

## 2017-02-08 MED ORDER — MAGNESIUM HYDROXIDE 400 MG/5ML PO SUSP: 30.0000 mL | ORAL | Status: DC | PRN

## 2017-02-08 MED ORDER — OXYCODONE HCL 5 MG PO TABS: 5.0000 mg | ORAL_TABLET | Freq: Once | ORAL | Status: DC | PRN

## 2017-02-08 MED ORDER — OXYCODONE-ACETAMINOPHEN 5-325 MG PO TABS
1.0000 | ORAL_TABLET | ORAL | Status: DC | PRN
  Administered 2017-02-09 – 2017-02-10 (×2): 1 via ORAL
  Filled 2017-02-08 (×2): qty 1

## 2017-02-08 MED ORDER — OXYCODONE HCL 5 MG/5ML PO SOLN: 5.0000 mg | Freq: Once | ORAL | Status: DC | PRN

## 2017-02-08 MED ORDER — PROMETHAZINE HCL 25 MG/ML IJ SOLN: 6.2500 mg | INTRAMUSCULAR | Status: DC | PRN

## 2017-02-08 MED ORDER — LIDOCAINE-EPINEPHRINE (PF) 2 %-1:200000 IJ SOLN
INTRAMUSCULAR | Status: AC
  Filled 2017-02-08: qty 20

## 2017-02-08 MED ORDER — SODIUM BICARBONATE 8.4 % IV SOLN
INTRAVENOUS | Status: DC | PRN
  Administered 2017-02-08: 6 mL via EPIDURAL
  Administered 2017-02-08: 4 mL via EPIDURAL
  Administered 2017-02-08: 2 mL via EPIDURAL
  Administered 2017-02-08: 5 mL via EPIDURAL

## 2017-02-08 MED ORDER — TETANUS-DIPHTH-ACELL PERTUSSIS 5-2.5-18.5 LF-MCG/0.5 IM SUSP: 0.5000 mL | Freq: Once | INTRAMUSCULAR | Status: DC

## 2017-02-08 MED ORDER — MEPERIDINE HCL 25 MG/ML IJ SOLN
INTRAMUSCULAR | Status: AC
  Filled 2017-02-08: qty 1

## 2017-02-08 MED ORDER — CEFOTETAN DISODIUM-DEXTROSE 2-2.08 GM-% IV SOLR
2.0000 g | INTRAVENOUS | Status: AC
  Administered 2017-02-08: 2 g via INTRAVENOUS
  Filled 2017-02-08: qty 50

## 2017-02-08 MED ORDER — ZOLPIDEM TARTRATE 5 MG PO TABS: 5.0000 mg | ORAL_TABLET | Freq: Every evening | ORAL | Status: DC | PRN

## 2017-02-08 MED ORDER — DEXTROSE 5 % IV SOLN
INTRAVENOUS | Status: AC
  Filled 2017-02-08: qty 3000

## 2017-02-08 MED ORDER — AZITHROMYCIN 500 MG PO TABS
1000.0000 mg | ORAL_TABLET | Freq: Once | ORAL | Status: AC
  Administered 2017-02-08: 1000 mg via ORAL
  Filled 2017-02-08: qty 2

## 2017-02-08 MED ORDER — ONDANSETRON HCL 4 MG/2ML IJ SOLN
INTRAMUSCULAR | Status: AC
  Filled 2017-02-08: qty 2

## 2017-02-08 MED ORDER — DIBUCAINE 1 % RE OINT: 1.0000 "application " | TOPICAL_OINTMENT | RECTAL | Status: DC | PRN

## 2017-02-08 MED ORDER — ACETAMINOPHEN 325 MG PO TABS
650.0000 mg | ORAL_TABLET | ORAL | Status: DC | PRN
  Administered 2017-02-08 – 2017-02-09 (×2): 650 mg via ORAL
  Filled 2017-02-08 (×2): qty 2

## 2017-02-08 MED ORDER — PIPERACILLIN-TAZOBACTAM 3.375 G IVPB
3.3750 g | Freq: Three times a day (TID) | INTRAVENOUS | Status: DC
  Administered 2017-02-08: 3.375 g via INTRAVENOUS
  Filled 2017-02-08 (×2): qty 50

## 2017-02-08 MED ORDER — BUPIVACAINE HCL (PF) 0.5 % IJ SOLN
INTRAMUSCULAR | Status: AC
  Filled 2017-02-08: qty 30

## 2017-02-08 MED ORDER — HYDROMORPHONE HCL 1 MG/ML IJ SOLN: 0.2500 mg | INTRAMUSCULAR | Status: DC | PRN

## 2017-02-08 MED ORDER — SODIUM CHLORIDE 0.9 % IR SOLN
Status: DC | PRN
  Administered 2017-02-08: 1000 mL

## 2017-02-08 MED ORDER — DIPHENHYDRAMINE HCL 25 MG PO CAPS: 25.0000 mg | ORAL_CAPSULE | Freq: Four times a day (QID) | ORAL | Status: DC | PRN

## 2017-02-08 MED ORDER — SENNOSIDES-DOCUSATE SODIUM 8.6-50 MG PO TABS
2.0000 | ORAL_TABLET | ORAL | Status: DC
  Administered 2017-02-09 (×2): 2 via ORAL
  Filled 2017-02-08 (×2): qty 2

## 2017-02-08 MED ORDER — MORPHINE SULFATE (PF) 0.5 MG/ML IJ SOLN
INTRAMUSCULAR | Status: DC | PRN
  Administered 2017-02-08: 4 mg via EPIDURAL

## 2017-02-08 MED ORDER — FERROUS SULFATE 325 (65 FE) MG PO TABS
325.0000 mg | ORAL_TABLET | Freq: Two times a day (BID) | ORAL | Status: DC
  Administered 2017-02-08 – 2017-02-10 (×4): 325 mg via ORAL
  Filled 2017-02-08 (×4): qty 1

## 2017-02-08 MED ORDER — PIPERACILLIN-TAZOBACTAM 3.375 G IVPB 30 MIN
3.3750 g | Freq: Once | INTRAVENOUS | Status: DC
  Filled 2017-02-08: qty 50

## 2017-02-08 MED ORDER — COCONUT OIL OIL
1.0000 "application " | TOPICAL_OIL | Status: DC | PRN
  Administered 2017-02-09: 1 via TOPICAL
  Filled 2017-02-08: qty 120

## 2017-02-08 MED ORDER — SODIUM BICARBONATE 8.4 % IV SOLN
INTRAVENOUS | Status: AC
  Filled 2017-02-08: qty 50

## 2017-02-08 MED ORDER — SIMETHICONE 80 MG PO CHEW
80.0000 mg | CHEWABLE_TABLET | ORAL | Status: DC | PRN
  Administered 2017-02-08: 80 mg via ORAL

## 2017-02-08 MED ORDER — ACETAMINOPHEN 500 MG PO TABS
1000.0000 mg | ORAL_TABLET | Freq: Once | ORAL | Status: AC
  Administered 2017-02-08: 1000 mg via ORAL

## 2017-02-08 MED ORDER — PIPERACILLIN-TAZOBACTAM 3.375 G IVPB: 3.3750 g | Freq: Three times a day (TID) | INTRAVENOUS | Status: DC

## 2017-02-08 MED ORDER — PRENATAL MULTIVITAMIN CH
1.0000 | ORAL_TABLET | Freq: Every day | ORAL | Status: DC
  Administered 2017-02-09 – 2017-02-10 (×2): 1 via ORAL
  Filled 2017-02-08 (×2): qty 1

## 2017-02-08 MED ORDER — ACETAMINOPHEN 500 MG PO TABS
ORAL_TABLET | ORAL | Status: AC
  Filled 2017-02-08: qty 2

## 2017-02-08 MED ORDER — OXYCODONE-ACETAMINOPHEN 5-325 MG PO TABS: 2.0000 | ORAL_TABLET | ORAL | Status: DC | PRN

## 2017-02-08 MED ORDER — SCOPOLAMINE 1 MG/3DAYS TD PT72
MEDICATED_PATCH | TRANSDERMAL | Status: DC | PRN
  Administered 2017-02-08: 1 via TRANSDERMAL

## 2017-02-08 MED ORDER — BUPIVACAINE HCL (PF) 0.5 % IJ SOLN
INTRAMUSCULAR | Status: DC | PRN
Start: 2017-02-08 — End: 2017-02-08
  Administered 2017-02-08: 30 mL

## 2017-02-08 MED ORDER — OXYTOCIN 10 UNIT/ML IJ SOLN
INTRAVENOUS | Status: DC | PRN
  Administered 2017-02-08: 40 [IU] via INTRAVENOUS

## 2017-02-08 MED ORDER — LACTATED RINGERS IV SOLN
INTRAVENOUS | Status: DC | PRN
  Administered 2017-02-08: 10:00:00 via INTRAVENOUS

## 2017-02-08 MED ORDER — WITCH HAZEL-GLYCERIN EX PADS: 1.0000 "application " | MEDICATED_PAD | CUTANEOUS | Status: DC | PRN

## 2017-02-08 MED ORDER — MENTHOL 3 MG MT LOZG: 1.0000 | LOZENGE | OROMUCOSAL | Status: DC | PRN

## 2017-02-08 MED ORDER — IBUPROFEN 600 MG PO TABS
600.0000 mg | ORAL_TABLET | Freq: Four times a day (QID) | ORAL | Status: DC
  Administered 2017-02-09 – 2017-02-10 (×7): 600 mg via ORAL
  Filled 2017-02-08 (×8): qty 1

## 2017-02-08 MED ORDER — MEASLES, MUMPS & RUBELLA VAC ~~LOC~~ INJ
0.5000 mL | INJECTION | Freq: Once | SUBCUTANEOUS | Status: DC
  Filled 2017-02-08: qty 0.5

## 2017-02-08 MED ORDER — SCOPOLAMINE 1 MG/3DAYS TD PT72
MEDICATED_PATCH | TRANSDERMAL | Status: AC
  Filled 2017-02-08: qty 1

## 2017-02-08 MED ORDER — OXYTOCIN 40 UNITS IN LACTATED RINGERS INFUSION - SIMPLE MED: 2.5000 [IU]/h | INTRAVENOUS | Status: AC

## 2017-02-08 MED ORDER — STERILE WATER FOR IRRIGATION IR SOLN
Status: DC | PRN
  Administered 2017-02-08: 200 mL

## 2017-02-08 SURGICAL SUPPLY — 37 items
BENZOIN TINCTURE PRP APPL 2/3 (GAUZE/BANDAGES/DRESSINGS) ×3 IMPLANT
CHLORAPREP W/TINT 26ML (MISCELLANEOUS) ×3 IMPLANT
CLAMP CORD UMBIL (MISCELLANEOUS) IMPLANT
CLOSURE WOUND 1/2 X4 (GAUZE/BANDAGES/DRESSINGS) ×1
CLOTH BEACON ORANGE TIMEOUT ST (SAFETY) ×3 IMPLANT
DECANTER SPIKE VIAL GLASS SM (MISCELLANEOUS) ×3 IMPLANT
DRSG OPSITE POSTOP 4X10 (GAUZE/BANDAGES/DRESSINGS) ×3 IMPLANT
ELECT REM PT RETURN 9FT ADLT (ELECTROSURGICAL) ×3
ELECTRODE REM PT RTRN 9FT ADLT (ELECTROSURGICAL) ×1 IMPLANT
EXTRACTOR VACUUM M CUP 4 TUBE (SUCTIONS) IMPLANT
EXTRACTOR VACUUM M CUP 4' TUBE (SUCTIONS)
GAUZE SPONGE 4X4 12PLY STRL LF (GAUZE/BANDAGES/DRESSINGS) ×6 IMPLANT
GLOVE BIOGEL PI IND STRL 7.0 (GLOVE) ×3 IMPLANT
GLOVE BIOGEL PI INDICATOR 7.0 (GLOVE) ×6
GLOVE ECLIPSE 7.0 STRL STRAW (GLOVE) ×3 IMPLANT
GOWN STRL REUS W/TWL LRG LVL3 (GOWN DISPOSABLE) ×6 IMPLANT
HEMOSTAT ARISTA ABSORB 3G PWDR (MISCELLANEOUS) ×3 IMPLANT
KIT ABG SYR 3ML LUER SLIP (SYRINGE) IMPLANT
NEEDLE HYPO 22GX1.5 SAFETY (NEEDLE) ×3 IMPLANT
NEEDLE HYPO 25X5/8 SAFETYGLIDE (NEEDLE) ×3 IMPLANT
NS IRRIG 1000ML POUR BTL (IV SOLUTION) ×3 IMPLANT
PACK C SECTION WH (CUSTOM PROCEDURE TRAY) ×3 IMPLANT
PAD ABD 7.5X8 STRL (GAUZE/BANDAGES/DRESSINGS) ×3 IMPLANT
PAD OB MATERNITY 4.3X12.25 (PERSONAL CARE ITEMS) ×3 IMPLANT
PENCIL SMOKE EVAC W/HOLSTER (ELECTROSURGICAL) ×3 IMPLANT
RTRCTR C-SECT PINK 25CM LRG (MISCELLANEOUS) IMPLANT
SPONGE LAP 18X18 X RAY DECT (DISPOSABLE) ×3 IMPLANT
STRIP CLOSURE SKIN 1/2X4 (GAUZE/BANDAGES/DRESSINGS) ×2 IMPLANT
SUT PDS AB 0 CTX 36 PDP370T (SUTURE) IMPLANT
SUT PLAIN 2 0 XLH (SUTURE) IMPLANT
SUT VIC AB 0 CTX 36 (SUTURE) ×9
SUT VIC AB 0 CTX36XBRD ANBCTRL (SUTURE) ×3 IMPLANT
SUT VIC AB 4-0 KS 27 (SUTURE) ×3 IMPLANT
SYR CONTROL 10ML LL (SYRINGE) ×3 IMPLANT
TOWEL OR 17X24 6PK STRL BLUE (TOWEL DISPOSABLE) ×3 IMPLANT
TRAY FOLEY BAG SILVER LF 14FR (SET/KITS/TRAYS/PACK) ×3 IMPLANT
WATER STERILE IRR 1000ML UROMA (IV SOLUTION) ×3 IMPLANT

## 2017-02-08 NOTE — Lactation Note (Signed)
This note was copied from a baby's chart. Lactation Consultation Note  Patient Name: Caitlyn Galvan ZOXWR'UToday's Date: 02/08/2017 Reason for consult: Initial assessment  Initial visit at 12 hours of age. Mom reports baby is doing well on right breast, but wont latch to left.  Mom is able to do hand expression with drops expressed from right breast only.  LC assisted for several minutes and no colostrum noted from left breast at this time.  Mom reports experience with older child nursing for a few days and mom having increase in milk volume.  Mom denies breast changes during this pregnancy.  Mom has flat/short nipple with compressible areola.  Mom is 12 hours post c/s and encouraged to wear a bra to help with potential breast edema, mom reports she doesn't have a bra.  LC encouraged mom to continue working on hand expression.   Mom labored for 48 hours prior to c/s with recorded EBL of 1500.  Lc discussed using DEBP to help stimulate milk supply,  Mom is agreeable.   Tech at bedside to give baby bath.  Lc reported to RN, Patty and asked her to set up DEBP later this evening to help establish a good milk supply and offer to baby any EBM.   [facility] given and discussed.  Encouraged to feed with early cues on demand 8-12x/day.  Early newborn behavior discussed.  Hand expression demonstrated by mom with colostrum visible.  Mom to call for assist as needed.     Maternal Data Has patient been taught Hand Expression?: Yes Does the patient have breastfeeding experience prior to this delivery?: Yes  Feeding Feeding Type: Breast Fed Length of feed: 10 min  LATCH Score/Interventions Latch: Repeated attempts needed to sustain latch, nipple held in mouth throughout feeding, stimulation needed to elicit sucking reflex. Intervention(s): Breast massage;Assist with latch  Audible Swallowing: None Intervention(s): Skin to skin;Hand expression (unable to hand express colostrum)  Type of Nipple:  Everted at rest and after stimulation  Comfort (Breast/Nipple): Soft / non-tender     Hold (Positioning): Assistance needed to correctly position infant at breast and maintain latch. Intervention(s): Breastfeeding basics reviewed  LATCH Score: 6  Lactation Tools Discussed/Used WIC Program: Yes   Consult Status Consult Status: Follow-up Date: 02/09/17 Follow-up type: In-patient    Kinzley Savell, Arvella MerlesJana Lynn 02/08/2017, 9:57 PM

## 2017-02-08 NOTE — Progress Notes (Signed)
Faculty Practice OB/GYN Attending Note   Subjective:  Called to evaluate patient with some variable and late FHR decelerations resolved after neonatal intrauterine resuscitation maneuvers.  Of note, patient also has arrest of cervical dilation; has been on pitocin for over 48 hours and made minimal change from 5 cm.  She is requesting repeat cesarean delivery; reports her first cesarean section was also for arrest of cervical dilation at 5 cm.      Objective:  Blood pressure 106/64, pulse 80, temperature 100 F (37.8 C), temperature source Axillary, resp. rate 18, height 5\' 3"  (1.6 m), weight 274 lb (124.3 kg), last menstrual period 05/23/2016, unknown if currently breastfeeding. FHT  Baseline 160 bpm, moderate variability, no accelerations, no decelerations Toco: q 4 mins Gen: NAD HENT: Normocephalic, atraumatic Lungs: Normal respiratory effort Heart: Regular rate noted Abdomen: NT, gravid fundus, soft Cervix: 4 cm/50%/-2  Assessment & Plan:  26 y.o. G3P1011 at 130w2d admitted for IOL/TOLAC for GHTN, now with arrest of cervical dilation.  Cesarean delivery recommended and also requested by patient. The risks of cesarean section discussed with the patient included but were not limited to: bleeding which may require transfusion or reoperation; infection which may require antibiotics; injury to bowel, bladder, ureters or other surrounding organs; injury to the fetus; need for additional procedures including hysterectomy in the event of a life-threatening hemorrhage; placental abnormalities wth subsequent pregnancies, incisional problems, thromboembolic phenomenon and other postoperative/anesthesia complications. The patient concurred with the proposed plan, giving informed written consent for the procedure.   Anesthesia and OR aware. Preoperative prophylactic antibiotics and SCDs ordered on call to the OR.  To OR when ready.   [name]  Anis Degidio, MD, FACOG Attending Obstetrician &  Gynecologist Faculty Practice, [facility]

## 2017-02-08 NOTE — Op Note (Signed)
Caitlyn Galvan PROCEDURE DATE: 02/08/2017  PREOPERATIVE DIAGNOSES: Intrauterine pregnancy at 3982w2d weeks gestation; previous cesarean section; failed induction of labor due to gestational hypertension;  failure to progress: arrest of dilation  POSTOPERATIVE DIAGNOSES: The same  PROCEDURE: Repeat Low Transverse Cesarean Section  SURGEON:  Dr. [name] Alfred Harrel  ANESTHESIOLOGY TEAM: Anesthesiologist: Achille RichHodierne, Adam, MD; Lowella CurbMiller, Warren Ray, MD CRNA: Algis GreenhouseBurger, Linda A, CRNA; Rica Recordsickelton, Angela, CRNA  INDICATIONS: Caitlyn Galvan is a 26 y.o. [id number] at 4182w2d here for cesarean section secondary to the indications listed under preoperative diagnoses; please see preoperative note for further details.  The risks of cesarean section were discussed with the patient including but were not limited to: bleeding which may require transfusion or reoperation; infection which may require antibiotics; injury to bowel, bladder, ureters or other surrounding organs; injury to the fetus; need for additional procedures including hysterectomy in the event of a life-threatening hemorrhage; placental abnormalities wth subsequent pregnancies, incisional problems, thromboembolic phenomenon and other postoperative/anesthesia complications.   The patient concurred with the proposed plan, giving informed written consent for the procedure.    FINDINGS:  Viable female infant in cephalic, asynclitic, occiput posterior presentation.  Apgars 10 and 10, weight 7lb 5.5 oz.  Clear amniotic fluid. The uterus was noted to have moderate atony after delivery which also led to increased blood loss; this was ameliorated with fundal massage and pitocin administration.  There was also an extension of the hysterotomy down the lower uterine segment on the right; this occurred when trying to deliver the fetal head which was noted to be tightly wedged in the pelvis.  Intact placenta, three vessel cord.  Normal uterus, fallopian tubes and ovaries  bilaterally. The bladder was adherent to anterior abdominal wall, and upon entry to the abdomen, there was concern about possible bladder injury.  No injury was visualized, backfill of the bladder with sterile milk solution did not show any injury. .   ANESTHESIA: Epidural  INTRAVENOUS FLUIDS: 2500 ml   ESTIMATED BLOOD LOSS: 1500 ml URINE OUTPUT:  100 ml SPECIMENS: Placenta sent to L&D  PROCEDURE IN DETAIL:  The patient preoperatively received intravenous antibiotics and had sequential compression devices applied to her lower extremities.  She was then taken to the operating room where spinal anesthesia was administered the epidural anesthesia was dosed up to surgical level and was found to be adequate. She was then placed in a dorsal supine position with a leftward tilt, and prepped and draped in a sterile manner.  A foley catheter was placed into her bladder and attached to constant gravity.  After an adequate timeout was performed, a Pfannenstiel skin incision was made with scalpel over her preexisting scarand carried through to the underlying layer of fascia. The fascia was incised in the midline, and this incision was extended bilaterally using the Mayo scissors.  Kocher clamps were applied to the superior aspect of the fascial incision and the underlying rectus muscles were dissected off sharply.  A similar process was carried out on the inferior aspect of the fascial incision. The rectus muscles were separated in the midline sharply and the peritoneum was entered sharply.  There was immediate return of a copious amount of clear fluid from the peritoneal cavity; prompting concern about possible bladder injury. The bladder was noted to be tightly adherent to anterior abdominal wall and there was no clear dissection plane.  Exposure was obtained with horizontal transection of the adherent rectus muscles, about 1.5 cm in each side and blunt stretching. The Alexus  retractor was placed.  Attention was  turned to the lower uterine segment where a low transverse hysterotomy was made with a scalpel and extended bilaterally bluntly.  The infant was successfully delivered, the cord was clamped and cut after one minute, and the infant was handed over to the awaiting neonatology team. Uterine massage was then administered, and the placenta delivered intact with a three-vessel cord. The uterus was then cleared of clots and debris.  The hysterotomy extension was noted at this point and it and rest of the hysterotomy was closed with 0 Vicryl in a running locked fashion, and an imbricating layer was also placed with 0 Vicryl.  Figure-of-eight 0 Vicryl serosal stitches were placed to help with hemostasis.  The pelvis was cleared of all clot and debris. The bladder was backfilled with sterile milk solution and no injury was noted.  The retractor was removed.  Arista was placed on the serosal surface to help with hemostasis.  The fascia was then closed using 0 PDS in a running fashion.  The subcutaneous layer was irrigated, then reapproximated with 2-0 plain gut interrupted stitches, and 30 ml of 0.5% Marcaine was injected subcutaneously around the incision.  The skin was closed with a 4-0 Vicryl subcuticular stitch. The patient tolerated the procedure well. Sponge, lap, instrument and needle counts were correct x 3.  She was taken to the recovery room in stable condition.    Jaynie Collins, MD, FACOG Attending Obstetrician & Gynecologist Faculty Practice, [facility]

## 2017-02-08 NOTE — Progress Notes (Signed)
CRNA re-dosed epidural in attempts to control pain pt is having vaginally

## 2017-02-08 NOTE — Progress Notes (Signed)
Patient with postpartum temperature on 100.8.  Given prolonged ROM and labor course with subsequent complex cesarean section, she is at high risk of intrauterine and intraabdominal infection. Zosyn ordered for 24 hours, also added one dose of Azithromycin. Will continue to watch closely.   [name]  Symphany Fleissner, MD, FACOG Attending Obstetrician & Gynecologist, [facility], [facility]

## 2017-02-08 NOTE — Anesthesia Postprocedure Evaluation (Signed)
Anesthesia Post Note  Patient: Caitlyn Galvan  Procedure(s) Performed: Procedure(s) (LRB): CESAREAN SECTION (N/A)     Patient location during evaluation: Mother Baby Anesthesia Type: Epidural Level of consciousness: awake and alert Pain management: pain level controlled Vital Signs Assessment: post-procedure vital signs reviewed and stable Respiratory status: spontaneous breathing, nonlabored ventilation and respiratory function stable Cardiovascular status: stable Postop Assessment: no headache, no backache and epidural receding Anesthetic complications: no    Last Vitals:  Vitals:   02/08/17 1211 02/08/17 1214  BP: (!) 103/44 116/64  Pulse: 97   Resp: 20   Temp: (!) 38.7 C     Last Pain:  Vitals:   02/08/17 1211  TempSrc: Oral  PainSc:    Pain Goal:                 Lowella CurbWarren Ray Preeti Winegardner

## 2017-02-08 NOTE — Transfer of Care (Signed)
Immediate Anesthesia Transfer of Care Note  Patient: Caitlyn Galvan  Procedure(s) Performed: Procedure(s): CESAREAN SECTION (N/A)  Patient Location: PACU  Anesthesia Type:Epidural  Level of Consciousness: awake, alert  and oriented  Airway & Oxygen Therapy: Patient Spontanous Breathing and Patient connected to nasal cannula oxygen  Post-op Assessment: Report given to RN and Post -op Vital signs reviewed and stable  Post vital signs: Reviewed and stable  Last Vitals:  Vitals:   02/08/17 0848 02/08/17 1104  BP:  (!) 109/54  Pulse:  87  Resp:  20  Temp: 37.8 C 37.2 C    Last Pain:  Vitals:   02/08/17 1104  TempSrc: Oral  PainSc:          Complications: No apparent anesthesia complications

## 2017-02-09 ENCOUNTER — Encounter (HOSPITAL_COMMUNITY): Payer: Self-pay | Admitting: Obstetrics & Gynecology

## 2017-02-09 LAB — CBC
HCT: 24.2 % — ABNORMAL LOW (ref 36.0–46.0)
Hemoglobin: 8.1 g/dL — ABNORMAL LOW (ref 12.0–15.0)
MCH: 28.9 pg (ref 26.0–34.0)
MCHC: 33.5 g/dL (ref 30.0–36.0)
MCV: 86.4 fL (ref 78.0–100.0)
PLATELETS: 228 10*3/uL (ref 150–400)
RBC: 2.8 MIL/uL — ABNORMAL LOW (ref 3.87–5.11)
RDW: 15.5 % (ref 11.5–15.5)
WBC: 13.5 10*3/uL — ABNORMAL HIGH (ref 4.0–10.5)

## 2017-02-09 NOTE — Progress Notes (Signed)
Pt up to bathroom without dizziness- gait steady. Peri care done and then pt up in room. To call for assist prn

## 2017-02-09 NOTE — Lactation Note (Signed)
This note was copied from a baby's chart. Lactation Consultation Note  Patient Name: Caitlyn Galvan VHQIO'NToday's Date: 02/09/2017 Reason for consult: Follow-up assessment   Follow-up consult at 6437 hrs old; [facility]  Mom is P2.   Mom stated she has finally got infant latched on left side.  Mom was feeding on left side when LC came in and had been feeding on left for 25 minutes.  Infant was in a consistent rhythm and swallows heard.  LS-8 for current feeding by LC. Mom had latched infant independently on left side.  States her right side is getting sore.  Right nipple appears pink in color but no cracking noted.  Encouraged mom to hand express colostrum to apply to nipple and have RN bring coconut oil for mom to apply to right nipple.  Mom was not wearing bra at this visit for giving comfort gels, told mom to try HE colostrum and coconut oil first on nipple.  Encouraged mom to also feed in cross-cradle on right since she has been feeding in football on right.   Mom has hand pump in room she plans to use for stimulating right since she plans to only feed from left for next few feedings to give right side a break.  Explained engorgement prevention by using hand pump since she is giving right a break from latching for next couple feedings.     Infant has breastfed x5 (10-25 min) + attempt x3 (0min); voids-2 in 24 hrs/ 3 life; stools-7 in 24 hrs and life.   LC encouraged mom to continue feeding with cues and waking as needed if infant is sleeping 2.5-3 hrs since infant only had 5 breastfeedings over 10 minutes in past 24 hrs.  Explained infant needs to feed 8-12 times per day.   Explained to mom infant may cluster feed tonight during the night.   Encouraged to call for assistance as needed.     Maternal Data    Feeding Feeding Type: Breast Fed  LATCH Score/Interventions Latch: Repeated attempts needed to sustain latch, nipple held in mouth throughout feeding,  stimulation needed to elicit sucking reflex.  Audible Swallowing: A few with stimulation  Type of Nipple: Everted at rest and after stimulation  Comfort (Breast/Nipple): Soft / non-tender (on left non tender; mom states right is sore.)  Problem noted: Mild/Moderate discomfort (on right)  Hold (Positioning): No assistance needed to correctly position infant at breast.  LATCH Score: 8  Lactation Tools Discussed/Used     Consult Status Consult Status: Follow-up Date: 02/10/17 Follow-up type: In-patient    Lendon KaVann, Caitlyn Galvan 02/09/2017, 10:58 PM

## 2017-02-09 NOTE — Anesthesia Postprocedure Evaluation (Signed)
Anesthesia Post Note  Patient: Caitlyn Galvan  Procedure(s) Performed: Procedure(s) (LRB): CESAREAN SECTION (N/A)     Patient location during evaluation: Mother Baby Anesthesia Type: Epidural Level of consciousness: awake Pain management: pain level controlled Vital Signs Assessment: post-procedure vital signs reviewed and stable Respiratory status: spontaneous breathing Cardiovascular status: stable Postop Assessment: no headache, no backache, epidural receding and patient able to bend at knees Anesthetic complications: no    Last Vitals:  Vitals:   02/09/17 0236 02/09/17 0621  BP: 115/73 122/68  Pulse: 83 78  Resp: 18 18  Temp: 36.6 C 36.5 C    Last Pain:  Vitals:   02/09/17 0621  TempSrc:   PainSc: 4    Pain Goal: Patients Stated Pain Goal: 2 (02/08/17 1830)               Edison PaceWILKERSON,Lanita Stammen

## 2017-02-09 NOTE — Plan of Care (Signed)
Problem: Activity: Goal: Risk for activity intolerance will decrease Discussed the importance of increased ambulation today.   Problem: Nutritional: Goal: Mothers verbalization of comfort with breastfeeding process will improve Encouraged mother to call for breast feeding assistance.   Problem: Pain Management: Goal: General experience of comfort will improve and pain level will decrease Discussed pain medication options and encouraged patient to call before pain became too intense. Patient comfortable with tylenol and motrin at this time.

## 2017-02-09 NOTE — Progress Notes (Signed)
POD/Post Partum Day 2 Subjective: no complaints, up ad lib, tolerating PO and + flatus Due to void, foley recently d/c'd  Objective: Blood pressure 122/68, pulse 78, temperature 97.7 F (36.5 C), resp. rate 18, height 5\' 3"  (1.6 m), weight 124.3 kg (274 lb), last menstrual period 05/23/2016, SpO2 97 %, unknown if currently breastfeeding.  Physical Exam:  General: alert, cooperative and no distress Lochia: appropriate Lungs: CTAB Heart: RRR Uterine Fundus: firm Incision: healing well, no significant drainage, no dehiscence, no significant erythema DVT Evaluation: No evidence of DVT seen on physical exam. Negative Homan's sign. No cords or calf tenderness. No significant calf/ankle edema.   Recent Labs  02/08/17 1436 02/09/17 0536  HGB 9.5* 8.1*  HCT 29.0* 24.2*    Assessment/Plan: Plan for discharge tomorrow Gestational HTN-stable Triple I-resolved   LOS: 3 days   Donette LarryMelanie Rubi Tooley, CNM 02/09/2017, 8:07 AM

## 2017-02-09 NOTE — Progress Notes (Signed)
Patient stood at bedside and gown and pads changed- pt reports feeling "a little woozy" so did not go to bathroom but did tolerate standing at bedside. Pt then sat on side of bed for approx 20 minutes without dizziness.

## 2017-02-09 NOTE — Progress Notes (Signed)
O2 sats down to upper 80's intermittently while pt sleeping- back to mid 90's when awake. Pt denies history of sleep apnea but having occasional snoring type respirations- will notify MD

## 2017-02-09 NOTE — Progress Notes (Signed)
Last ordered dose of Zosyn running and patient has received about half the bag. IV infiltrated. Notified Steffanie RainwaterNick Schenk, MD who stated to leave IV out for now and notify MD if patient begins running fever again. Will continue to monitor. Earl Galasborne, Linda HedgesStefanie ClintHudspeth

## 2017-02-09 NOTE — Progress Notes (Signed)
Nursing called to notify of infiltrated IV. She was receiving zosyn at that time. She received about half of the dose. She received azithromycin prior to surgery and received 2 whole doses of zosyn. She has been afebrile since 12PM yesterday. Will stop ABX at this time. If she becomed febrile again will restart.   Ernestina PennaNicholas Schenk, MD 02/09/17 9:50 AM

## 2017-02-09 NOTE — Addendum Note (Signed)
Addendum  created 02/09/17 0734 by Earmon PhoenixWilkerson, Suheily Birks P, CRNA   Charge Capture section accepted, Sign clinical note

## 2017-02-10 MED ORDER — IBUPROFEN 600 MG PO TABS: 600.0000 mg | ORAL_TABLET | Freq: Four times a day (QID) | ORAL | 0 refills | Status: DC | PRN

## 2017-02-10 MED ORDER — TRIAMTERENE-HCTZ 37.5-25 MG PO TABS
1.0000 | ORAL_TABLET | Freq: Every day | ORAL | 3 refills | Status: DC
Start: 2017-02-10 — End: 2017-11-04

## 2017-02-10 MED ORDER — OXYCODONE-ACETAMINOPHEN 5-325 MG PO TABS: 1.0000 | ORAL_TABLET | ORAL | 0 refills | Status: DC | PRN

## 2017-02-10 MED ORDER — TRIAMTERENE-HCTZ 37.5-25 MG PO TABS
1.0000 | ORAL_TABLET | Freq: Every day | ORAL | Status: DC
  Administered 2017-02-10: 1 via ORAL
  Filled 2017-02-10: qty 1

## 2017-02-10 MED ORDER — FERROUS SULFATE 325 (65 FE) MG PO TABS: 325.0000 mg | ORAL_TABLET | Freq: Two times a day (BID) | ORAL | 3 refills | Status: DC

## 2017-02-10 NOTE — Plan of Care (Signed)
Problem: Education: Goal: Knowledge of condition will improve Discharge education, incision care, follow up reviewed with patient. Patient verbalizes understanding.

## 2017-02-10 NOTE — Discharge Summary (Signed)
OB Discharge Summary     Patient Name: Caitlyn Galvan DOB: 1990-10-04 MRN: [id number]  Date of admission: 02/06/2017 Delivering MD: Jaynie Collins A   Date of discharge: 02/10/2017  Admitting diagnosis: INDUCTION Intrauterine pregnancy: [redacted]w[redacted]d     Secondary diagnosis:  Active Problems:   Gestational hypertension without significant proteinuria Prev C/S  Additional problems: obesity     Discharge diagnosis: Term Pregnancy Delivered, Gestational Hypertension, Anemia and failed TOLAC                                                                                                Post partum procedures:none  Augmentation: AROM, Pitocin and Foley Balloon  Complications: Intrauterine Inflammation or infection (Chorioamniotis) and Hemorrhage>1075mL  Hospital course:  Induction of Labor With Cesarean Section  26 y.o. yo [id number] at [redacted]w[redacted]d was admitted to the hospital 02/06/2017 for induction of labor for gHTN. Patient had a labor course significant for reaching 5cm with no change x 48 hrs of Pitocin. FHR was a concern as well. The patient went for cesarean section due to Arrest of Dilation and Non-Reassuring FHR, and delivered a Viable infant,@BABYSUPPRESS (DBLINK,ept,110,,1,,) Membrane Rupture Time/Date: )1:28 PM ,02/07/2017   @Details  of operation can be found in separate operative Note.    Immediate PP temperature was 100.8 and so she was given Zosyn x 24h and a dose of Azithromycin.  Patient had an otherwise uncomplicated postpartum course. She is ambulating, tolerating a regular diet, passing flatus, and urinating well. Due to some BLE edema and slightly borderline BPs, Maxzide was started on POD#2.  Patient is discharged home in stable condition on 02/10/17 and will have a BP check in one week.                                Physical exam  Vitals:   02/09/17 0621 02/09/17 1030 02/09/17 1730 02/10/17 0528  BP: 122/68 131/74 (!) 145/65 139/85  Pulse: 78 92 (!) 108 84  Resp: 18 20 17  18   Temp: 97.7 F (36.5 C) 98.1 F (36.7 C) 98.9 F (37.2 C) 97.7 F (36.5 C)  TempSrc:  Oral Oral Oral  SpO2: 97% 98%    Weight:      Height:       General: alert and cooperative Lochia: appropriate Uterine Fundus: firm Incision: honeycomb intact, clean DVT Evaluation: No evidence of DVT seen on physical exam. Calf/Ankle edema is present Labs: Lab Results  Component Value Date   WBC 13.5 (H) 02/09/2017   HGB 8.1 (L) 02/09/2017   HCT 24.2 (L) 02/09/2017   MCV 86.4 02/09/2017   PLT 228 02/09/2017   CMP Latest Ref Rng & Units 01/22/2017  Glucose 65 - 99 mg/dL 84  BUN 6 - 20 mg/dL 7  Creatinine 1.47 - 8.29 mg/dL 5.62(Z)  Sodium 308 - 657 mmol/L 135  Potassium 3.5 - 5.1 mmol/L 3.5  Chloride 101 - 111 mmol/L 103  CO2 22 - 32 mmol/L 25  Calcium 8.9 - 10.3 mg/dL 8.4(O)  Total Protein 6.5 - 8.1 g/dL 7.1  Total Bilirubin 0.3 - 1.2 mg/dL 1.6(X0.2(L)  Alkaline Phos 38 - 126 U/L 68  AST 15 - 41 U/L 24  ALT 14 - 54 U/L 21    Discharge instruction: per After Visit Summary and "Baby and Me Booklet".  After visit meds:  Allergies as of 02/10/2017   No Known Allergies     Medication List    STOP taking these medications   acetaminophen 650 MG CR tablet Commonly known as:  TYLENOL   COMFORT FIT MATERNITY SUPP LG Misc   ranitidine 150 MG tablet Commonly known as:  ZANTAC     TAKE these medications   ferrous sulfate 325 (65 FE) MG tablet Take 1 tablet (325 mg total) by mouth 2 (two) times daily with a meal.   ibuprofen 600 MG tablet Commonly known as:  ADVIL,MOTRIN Take 1 tablet (600 mg total) by mouth every 6 (six) hours as needed.   oxyCODONE-acetaminophen 5-325 MG tablet Commonly known as:  PERCOCET/ROXICET Take 1 tablet by mouth every 4 (four) hours as needed (pain scale 4-7).   PRENATE PIXIE 10-0.6-0.4-200 MG Caps Take 1 capsule by mouth daily.   triamterene-hydrochlorothiazide 37.5-25 MG tablet Commonly known as:  MAXZIDE-25 Take 1 tablet by mouth daily.        Diet: routine diet  Activity: Advance as tolerated. Pelvic rest for 6 weeks.   Outpatient follow up:BP check in 1 week; PP visit in 4 weeks Follow up Appt:Future Appointments Date Time Provider Department Center  03/09/2017 9:30 AM Constant, Gigi GinPeggy, MD [facility] None   Follow up Visit:No Follow-up on file.  Postpartum contraception: Progesterone only pills  Newborn Data: Live born female  Birth Weight: 7 lb 5.5 oz (3330 g) APGAR: 10, 10  Baby Feeding: Breast Disposition:home with mother   02/10/2017 Cam HaiSHAW, KIMBERLY, CNM  7:29 AM

## 2017-02-10 NOTE — Progress Notes (Signed)
CSW received consult for hx of Anxiety and Depression.  CSW met with MOB to offer support and complete assessment.   MOB was receptive to CSW's visit and found to be very easy to engage.  She sat with CSW and provided full attention to the conversation.  She reports that she has never been diagnosed with Anxiety, but feels she suffers from symptoms of Anxiety on a regular basis.  She specifically spoke about how she becomes panicked when she is late or if she does not get things accomplished as planned.  She states she felt "overwhelmed" during this pregnancy because she hadn't gotten everything prepared for baby on the timeline she had hoped.  CSW asked how she coped with this and she replied, "my friends and my mom."  She reports that her mother visited from [place] to help get everything ready for baby and that she has a great group of friends here locally.  She states she and FOB/Damon Lucia Gaskins are in a relationship, but that his job is to care for an individual with CP, which means he is never home at night, but available to his family during the day.  She states his grandmother is traveling here from [place] and feels she will be a big help over the first few weeks at home with baby and her 73 year old son.  CSW provided education regarding the baby blues period vs. perinatal mood disorders, discussed treatment and gave resources for mental health follow up.  MOB is interested in seeking treatment for her daily Anxiety, which CSW commends.  CSW also recommends self-evaluation during the postpartum time period using the New Mom Checklist from Postpartum Progress and encouraged MOB to contact a medical professional if symptoms are noted at any time.   CSW provided review of Sudden Infant Death Syndrome (SIDS) precautions.   CSW identifies no further need for intervention and no barriers to discharge at this time.

## 2017-02-12 ENCOUNTER — Telehealth (HOSPITAL_COMMUNITY): Payer: Self-pay | Admitting: Lactation Services

## 2017-02-12 NOTE — Telephone Encounter (Signed)
Mother called stating baby was very fussy last night and had difficulty latching.  She is pumping at least q3 hours when baby does not latch.  She is pumping approx 2 oz on R side and .5-1 oz on left side.  Mother is supplementing also with formula.  Recommend hand expressing or prepumping before latching baby.  Suggest hand expressing before latching and before & after pumping.  Mother denies shooting pain up into her breast but states her nipples are tender but with no cracks or bleeding.  Encouraged depth and massaging breast during feeding to see if it helps baby sustain suck.  Suggest mother watch video "Maximizing your milk supply" to increase milk flow on left side.  Set up OP appt for 7/17 at 3:30p.

## 2017-02-17 ENCOUNTER — Ambulatory Visit: Payer: Self-pay

## 2017-02-17 NOTE — Lactation Note (Signed)
This note was copied from a baby's chart. Lactation Consult  Mother's reason for visit:  Poor latch, falling asleep at breast Visit Type:  Feeding assessment Appointment Notes:  See Below Consult:  Initial Lactation Consultant:  Caitlyn Galvan  ________________________________________________________________________  Caitlyn Galvan Name:  Caitlyn Galvan Date of Birth:  02/08/2016 Pediatrician:  Caitlyn Galvan Gender:  female Gestational Age: [redacted]w[redacted]d (At Birth) Birth Weight:  7 lb 5.5 oz (3330 g) Weight at Discharge:                          Date of Discharge:   There were no vitals filed for this visit. [facility]  7 2.5 oz     Location:Pediatrician's office Weight today:  7 lb 3.6 oz (3278 grams) with clean diaper  ________________________________________________________________________  Mother's Name: Caitlyn Galvan Type of delivery:  C-Section, Low Transverse Breastfeeding Experience:  Not latching consistently, will fight at the breast, attempts for 10-15 minutes and then gives bottle Maternal Medical Conditions:  Pregnancy induced hypertension Maternal Medications:  PNV, Oxycondone PRN, Ibuprofen PRN, Fe, Triamterene (fluid pill)  ________________________________________________________________________  Breastfeeding History (Post Discharge)  Frequency of breastfeeding:  2-3 x /day Duration of feeding:  10-15 minutes (falling asleep)  Supplementation  Breastmilk:  Volume 60-75 ml Frequency:  Every 2-4 hours    Method:  Bottle, [facility] and Dr. Theora Gianotti  Pumping  Type of pump:  Medela pump in style and Ameda Frequency:  Every 2-3 hours Volume:  60-120 ml  Infant Intake and Output Assessment  Voids:  5-6 in 24 hrs.  Color:  Clear yellow Stools:  7-8 in 24 hrs.  Color:  Yellow  ________________________________________________________________________  Maternal Breast Assessment  Breast:  Soft Nipple:   Erect Pain level:  0 Pain interventions:  Bra  _______________________________________________________________________ Feeding Assessment/Evaluation  Initial feeding assessment:  Infant's oral assessment:  Variance- infant with thick labial frenulum, lip stretches well, she tends to curl it in when latching  Positioning:  Football Left breast  LATCH documentation:  Latch:  1 = Repeated attempts needed to sustain latch, nipple held in mouth throughout feeding, stimulation needed to elicit sucking reflex.  Audible swallowing:  2 = Spontaneous and intermittent  Type of nipple:  2 = Everted at rest and after stimulation  Comfort (Breast/Nipple):  2 = Soft / non-tender  Hold (Positioning):  2 = No assistance needed to correctly position infant at breast  LATCH score:  9  Attached assessment:  Deep  Lips flanged:  Yes.    Lips untucked:  No.  Suck assessment:  Displays both   Pre-feed weight:  3278 g  (7 lb. 3.6 oz.) Post-feed weight:  3324 g (7 lb. 5.2 oz.) Amount transferred:  46 ml Amount supplemented:  0 ml  Additional Feeding Assessment -   Infant's oral assessment:  Variance see above  Positioning:  Cross cradle Right breast  LATCH documentation:  Latch:  2 = Grasps breast easily, tongue down, lips flanged, rhythmical sucking.  Audible swallowing:  2 = Spontaneous and intermittent  Type of nipple:  2 = Everted at rest and after stimulation  Comfort (Breast/Nipple):  2 = Soft / non-tender  Hold (Positioning):  2 = No assistance needed to correctly position infant at breast  LATCH score:  10  Attached assessment:  Deep  Lips flanged:  No.  Lips untucked:  Yes.    Suck assessment:  Displays both  Pre-feed weight:  3324 g  (7 lb. 5.2 oz.) Post-feed weight:  3356 g (7 lb. 6.4 oz.) Amount transferred:  32 ml Amount supplemented:  0 ml     Total amount transferred:  78 ml Total supplement given:  0 ml  Initial OP consult with mom of 9 day old infant born  at 4137w 2d. Mom reports she is BF infant at least 3 x a day and attempting to get her to latch more often. Infant is being supplemented with EBM via bottle when not BF. Mom has a good milk supply.  Mom with large compressible breasts with short shaft small nipples. Mom reports some assemmetry to nipple when infant comes off the breast with some milk burning to nipple for a few minutes afterwards. She also noted the same burning after pumping. She is turning suction to maximum, enc mom to keep at comfortable level. Mom is using Coconut oil to nipples. Infant with white on tongue but thought to not be thrush, infant was examined by Ped yesterday.   Mom latched infant to left breast in the football hold, took a few tries for her to latch on and she wanted to be shallow but mom able to get her latched properly. Enc mom to flange lips as needed with feeding. Infant fed for 10 minutes and transferred 46 ml. She was weighed and diaper changed and fed on the right breast in the cross cradle hold for an additional 10 minutes and transferred another 32 ml. Nipples were slightly asymmetrical after feeding, mom reports no pain during feedings. Nipple tissue intact and without bruising or cracking. Infant was quiet and alert after feeding. Mom concerned with wateriness of stools, stools were normal BM stools, mom reassured. Mom was pleased with infant feeding behavior while here.   Mom is to call if wants to schedule another appt. She is aware of BF Support Groups. Mom without further questions/concerns at this time. Infant with follow up Ped appt 03/16/17  Written plan given and reviewed with mom: Continue to offer breast with each feeding, empty first breast before offering second breast Supplement infant as needed after BF (wont need if she feeds as well as she did here) Infant needs approximately 60-80 ml every 3 hours if bottle feeding (amounts to be adjusted if feeds sooner or more than every 3 hours) Continue to  pump when infant not willing to BF, for comfort, or if breasts still full post BF Keep infant awake during feeding Massage breast with feeding Call for assistance as needed Keep up the great work!!

## 2017-02-18 ENCOUNTER — Ambulatory Visit (INDEPENDENT_AMBULATORY_CARE_PROVIDER_SITE_OTHER): Payer: BLUE CROSS/BLUE SHIELD | Admitting: Obstetrics & Gynecology

## 2017-02-18 NOTE — Progress Notes (Signed)
Subjective:     Caitlyn DickerMarissa R Galvan is a 26 y.o. female who presents to the clinic 1 weeks status post cesarean section for failed TOLAC. Eating a regular diet without difficulty. Bowel movements are normal. Pain is controlled without any medications. She noted some redness on the right side of her incision and odor The following portions of the patient's history were reviewed and updated as appropriate: allergies, current medications, past family history, past medical history, past social history, past surgical history and problem list.  Review of Systems Pertinent items are noted in HPI.   no fever Objective:    BP 134/88   Pulse (!) 103   Wt 240 lb (108.9 kg)   BMI 42.51 kg/m  General:  alert, cooperative and no distress  Abdomen: soft, non-tender  Incision:   healing well, no drainage, well approximated, local superficial erythema 2-3 cm on right, no dehiscence, incision well approximated     Assessment:    Doing well postoperatively. minimal superficial skin irritation  Operative findings again reviewed. Pathology report discussed.    Plan:    1. Continue any current medications. 2. Wound care discussed. 3. Activity restrictions: no lifting more than 15 pounds 4. Anticipated return to work: 5 weeks. 5. Follow up: 3 weeks for postpartum visit  Adam PhenixArnold, Treyveon Mochizuki G, MD 02/18/2017

## 2017-02-18 NOTE — Patient Instructions (Signed)

## 2017-03-05 ENCOUNTER — Telehealth: Payer: Self-pay

## 2017-03-05 NOTE — Telephone Encounter (Signed)
Pt states that her c-section scar is opening with yellowish, green drainage coming from it. She states that there is also a little odor coming from the area. Pt  advised to go to [facility] to be evaluated

## 2017-03-06 ENCOUNTER — Inpatient Hospital Stay (HOSPITAL_COMMUNITY)
Admission: AD | Admit: 2017-03-06 | Discharge: 2017-03-06 | Disposition: A | Discharge status: Home / Self Care | Payer: Medicaid Other | Source: Ambulatory Visit | Attending: Obstetrics & Gynecology | Admitting: Obstetrics & Gynecology

## 2017-03-06 ENCOUNTER — Encounter (HOSPITAL_COMMUNITY): Payer: Self-pay

## 2017-03-06 DIAGNOSIS — O8600 Infection of obstetric surgical wound, unspecified: Secondary | ICD-10-CM

## 2017-03-06 DIAGNOSIS — Z87891 Personal history of nicotine dependence: Secondary | ICD-10-CM | POA: Diagnosis not present

## 2017-03-06 DIAGNOSIS — O9089 Other complications of the puerperium, not elsewhere classified: Secondary | ICD-10-CM | POA: Diagnosis present

## 2017-03-06 MED ORDER — SULFAMETHOXAZOLE-TRIMETHOPRIM 800-160 MG PO TABS: 1.0000 | ORAL_TABLET | Freq: Two times a day (BID) | ORAL | 0 refills | Status: AC

## 2017-03-06 NOTE — Discharge Instructions (Signed)
Wound Infection A wound infection happens when germs start to grow in the wound. Germs that cause wound infections are most commonly bacteria. Other types of infections can occur as well. In some cases, infection can cause the wound to break open. Wound infections need treatment. If a wound infection is left untreated, complications can occur. This may include an infection in your bloodstream (sepsis) or in a bone (osteomyelitis). What are the causes? This condition is caused by germs growing in the wound. What increases the risk? The following factors may make you more likely to develop this condition:  Having a weak body defense system (immune system).  Having diabetes.  Taking steroid medicines for a long time (chronic use).  Smoking.  Older age.  Being overweight.  What are the signs or symptoms? Symptoms of this condition include:  Having more redness, swelling, or pain at the wound site.  Having more blood, pus, or fluid at the wound site.  A bad smell coming from a wound or bandage (dressing).  Having a fever.  Feeling tired or fatigued.  How is this diagnosed? This condition is diagnosed with a medical history and physical exam. You may also have blood tests. How is this treated? This condition is treated with an antibiotic medicine. The infection should improve 24-48 hours after you start antibiotics. Any redness around the wound should stop spreading, and the wound should be less painful. Follow these instructions at home: Medicines  Take or apply over-the-counter and prescription medicines only as told by your health care provider.  If you were prescribed antibiotic medicine, take or apply it as told by your health care provider. Do not stop using the antibiotic even if your condition improves. Wound care  Clean the wound each day or as told by your health care provider. ? Wash the wound with mild soap and water. ? Rinse the wound with water to remove all  soap. ? Pat the wound dry with a clean towel. Do not rub it.  Follow instructions from your health care provider about how to take care of your wound. Make sure you: ? Wash your hands with soap and water before you change your dressing. If soap and water are not available, use hand sanitizer. ? Change your dressing as told by your health care provider. ? Leave stitches (sutures), skin glue, or adhesive strips in place, if this applies. These skin closures may need to stay in place for 2 weeks or longer. If adhesive strip edges start to loosen and curl up, you may trim the loose edges. Do not remove adhesive strips completely unless your health care provider tells you to do that. Some wounds are left open to heal on their own.  Check your wound every day for signs of infection. Watch for: ? More redness, swelling, or pain. ? More fluid or blood. ? Warmth. ? Pus or a bad smell. General instructions  Keep the dressing dry until your health care provider says it can be removed.  Do not take baths, swim, use a hot tub, or do anything that would put your wound underwater until your health care provider approves.  Raise (elevate) the injured area above the level of your heart while you are sitting or lying down.  Do not scratch or pick at the wound.  Keep all follow-up visits as told by your health care provider. This is important. Contact a health care provider if:  Your pain is not controlled with medicine.  You have more   redness, swelling, or pain around your wound.  You have more fluid or blood coming from your wound.  Your wound feels warm to the touch.  You have pus coming from your wound.  You continue to notice a bad smell coming from your wound or your dressing.  Your wound that was closed breaks open. Get help right away if:  You have a red streak going away from your wound.  You have a fever. This information is not intended to replace advice given to you by your  health care provider. Make sure you discuss any questions you have with your health care provider. Document Released: 04/19/2003 Document Revised: 01/02/2016 Document Reviewed: 01/08/2015 Elsevier Interactive Patient Education  2018 [employer] Inc[employer]  

## 2017-03-06 NOTE — MAU Provider Note (Signed)
History     CSN: [id number]  Arrival date and time: 03/06/17 1023   First Provider Initiated Contact with Patient 03/06/17 1039      Chief Complaint  Patient presents with  . Wound Check   Caitlyn Galvan is a 26 y.o. [id number] who is S/P RCS on 02/08/17. She reports that about 3 weeks ago she noticed it was a little red. She saw OB and was told it looked fine. Then yesterday she noticed an area opening up with green, malodorous drainage.    Wound Check  Treated in ED: 02/10/17. Prior ED Treatment: repeat c-section  The maximum temperature noted was less than 100.4 F. The temperature was taken using an oral thermometer. There has been colored ("greensih" with odor ) discharge from the wound. There is new redness present. There is no swelling present. There is new pain present.    Past Medical History:  Diagnosis Date  . Anxiety   . Depression   . Headache   . No pertinent past medical history   . Pregnancy induced hypertension     Past Surgical History:  Procedure Laterality Date  . ADENOIDECTOMY    . adnoids    . CESAREAN SECTION    . CESAREAN SECTION N/A 02/08/2017   Procedure: CESAREAN SECTION;  Surgeon: Tereso NewcomerAnyanwu, Ugonna A, MD;  Location: [facility];  Service: Obstetrics;  Laterality: N/A;  . DILATION AND CURETTAGE OF UTERUS    . DILATION AND EVACUATION N/A 07/19/2015   Procedure: DILATATION AND EVACUATION;  Surgeon: Mitchel HonourMegan Morris, DO;  Location: [facility];  Service: Gynecology;  Laterality: N/A;  . INCISE AND DRAIN ABCESS    . tubes in ears    . WISDOM TOOTH EXTRACTION      Family History  Problem Relation Age of Onset  . Depression Mother   . Diabetes Mother   . Hypertension Father   . Stroke Father   . Asthma Sister   . Depression Sister   . Hypertension Sister   . Mental retardation Sister   . Arthritis Maternal Aunt   . Asthma Maternal Aunt   . Diabetes Paternal Grandmother   . Alcohol abuse Neg Hx   . Birth defects Neg Hx   . Cancer Neg Hx   .  COPD Neg Hx   . Drug abuse Neg Hx   . Early death Neg Hx   . Hearing loss Neg Hx   . Hyperlipidemia Neg Hx   . Heart disease Neg Hx   . Kidney disease Neg Hx   . Learning disabilities Neg Hx   . Mental illness Neg Hx   . Miscarriages / Stillbirths Neg Hx   . Vision loss Neg Hx   . Varicose Veins Neg Hx     Social History  Substance Use Topics  . Smoking status: Former Smoker    Packs/day: 0.50    Years: 2.00    Types: Cigarettes, E-cigarettes    Quit date: 06/16/2014  . Smokeless tobacco: Former NeurosurgeonUser     Comment: 1/2 pack a week  . Alcohol use 0.0 oz/week     Comment: occasionally; not while preg    Allergies: No Known Allergies  Prescriptions Prior to Admission  Medication Sig Dispense Refill Last Dose  . ferrous sulfate 325 (65 FE) MG tablet Take 1 tablet (325 mg total) by mouth 2 (two) times daily with a meal. 60 tablet 3 Taking  . ibuprofen (ADVIL,MOTRIN) 600 MG tablet Take 1 tablet (600 mg total) by  mouth every 6 (six) hours as needed. 30 tablet 0 Taking  . oxyCODONE-acetaminophen (PERCOCET/ROXICET) 5-325 MG tablet Take 1 tablet by mouth every 4 (four) hours as needed (pain scale 4-7). 50 tablet 0 Taking  . Prenat-FeAsp-Meth-FA-DHA w/o A (PRENATE PIXIE) 10-0.6-0.4-200 MG CAPS Take 1 capsule by mouth daily.   Taking  . triamterene-hydrochlorothiazide (MAXZIDE-25) 37.5-25 MG tablet Take 1 tablet by mouth daily. 30 tablet 3 Taking    Review of Systems  Constitutional: Negative for chills and fever.  Gastrointestinal: Positive for abdominal pain (at incision ).  Genitourinary: Negative for vaginal bleeding and vaginal discharge.   Physical Exam   Blood pressure 128/86, pulse 86, temperature 98.4 F (36.9 C), temperature source Oral, resp. rate 16, unknown if currently breastfeeding.  Physical Exam  Nursing note and vitals reviewed. Constitutional: She is oriented to person, place, and time. She appears well-developed and well-nourished. No distress.  HENT:  Head:  Normocephalic.  Cardiovascular: Normal rate.   Respiratory: Effort normal.  GI: Soft. There is no tenderness. There is no rebound.  Small (less than 2cm) area of erythema along the right outer aspect of the incision. Purulent, malodorous drainage expressed from the incision   Genitourinary: Uterus normal.  Neurological: She is alert and oriented to person, place, and time.  Skin: Skin is warm and dry.  Psychiatric: She has a normal mood and affect.    MAU Course  Procedures  MDM 1055: D/W Dr. Macon LargeAnyanwu will DC home on Bactrim DS and FU on Monday   Assessment and Plan   1. Wound infection following cesarean section, postpartum    DC home Comfort measures reviewed  Return precautions discussed  RX: Bactrim DS BID x 7 days  Return to MAU as needed FU with OB as planned  Follow-up Information    [facility] [place] Follow up.   Specialty:  Obstetrics and Gynecology Contact information: [address], [address] [place] [id number] [phone]           Thressa ShellerHeather Karlon Schlafer 03/06/2017, 10:40 AM

## 2017-03-06 NOTE — MAU Note (Addendum)
Patient 4 weeks postpartum C/S, states incision has now opened up. Draining a greenish fluid with an odor.

## 2017-03-09 ENCOUNTER — Encounter: Payer: Self-pay | Admitting: Obstetrics and Gynecology

## 2017-03-09 ENCOUNTER — Ambulatory Visit (INDEPENDENT_AMBULATORY_CARE_PROVIDER_SITE_OTHER): Payer: Medicaid Other | Admitting: Obstetrics and Gynecology

## 2017-03-09 DIAGNOSIS — Z1389 Encounter for screening for other disorder: Secondary | ICD-10-CM

## 2017-03-09 DIAGNOSIS — Z3202 Encounter for pregnancy test, result negative: Secondary | ICD-10-CM | POA: Diagnosis not present

## 2017-03-09 LAB — POCT URINE PREGNANCY: Preg Test, Ur: NEGATIVE

## 2017-03-09 MED ORDER — NORETHINDRONE 0.35 MG PO TABS: 1.0000 | ORAL_TABLET | Freq: Every day | ORAL | 11 refills | Status: AC

## 2017-03-09 NOTE — Progress Notes (Signed)
Post Partum Exam  Caitlyn Galvan is a 26 y.o. 483P2012 female who presents for a postpartum visit. She is 4 weeks postpartum following a low cervical transverse Cesarean section. I have fully reviewed the prenatal and intrapartum course. The delivery was at 439w2d gestational weeks.  Anesthesia: epidural. Postpartum course has been uremarkable. Patient was recently treated for a wound infection. Baby's course has been unremarkable. Baby is feeding by breast. Bleeding no bleeding. Bowel function is normal. Bladder function is normal. Patient is sexually active without protection. Contraception method is none. Postpartum depression screening:neg EPDS: 7.     Review of Systems Pertinent items are noted in HPI.    Objective:  Blood pressure 130/87, pulse 89, height 5\' 3"  (1.6 m), weight 236 lb (107 kg), unknown if currently breastfeeding.  General:  alert, cooperative and no distress   Breasts:  inspection negative, no nipple discharge or bleeding, no masses or nodularity palpable  Lungs: clear to auscultation bilaterally  Heart:  regular rate and rhythm, S1, S2 normal, no murmur, click, rub or gallop  Abdomen: soft, non-tender; bowel sounds normal; no masses,  no organomegaly and incision is healing well. A 5 mm skin separation on the righ hand side of the incision. No erythema, induration or drainage   Vulva:  normal  Vagina: normal vagina, no discharge, exudate, lesion, or erythema  Cervix:  multiparous appearance  Corpus: normal size, contour, position, consistency, mobility, non-tender  Adnexa:  normal adnexa and no mass, fullness, tenderness  Rectal Exam: Not performed.        Assessment:    Normal postpartum exam. Normal pap smear 08/19/16.  Plan:   1. Contraception: oral progesterone-only contraceptive 2. Patient is cleared to resume all activities of daily living Patient to complete Bactrim  3. Follow up in: 5 months or as needed.

## 2017-04-29 ENCOUNTER — Telehealth (HOSPITAL_COMMUNITY): Payer: Self-pay

## 2017-04-29 NOTE — Telephone Encounter (Signed)
Mom has questions about increasing supply now that she is back at work a few weeks. LC encouraged mom to feed baby in the morning and the post pump.  Mom reports baby is sleeping 8-9 hours at night.  LC advised mom may need to pump during that time as it could affect her overall milk supply.  Mom to call back as needed.

## 2017-07-23 ENCOUNTER — Telehealth: Payer: Self-pay | Admitting: Pediatrics

## 2017-07-23 MED ORDER — FENUGREEK 610 MG PO CAPS: 1.0000 | ORAL_CAPSULE | Freq: Three times a day (TID) | ORAL | 1 refills | Status: DC

## 2017-07-23 NOTE — Telephone Encounter (Signed)
Pt states she saw lactation today and would like us to send rx for galactagogues.  She states she needs to increase milk supply while breast feeding.

## 2017-07-23 NOTE — Telephone Encounter (Signed)
I spoke with patient and advised of recommendations. She voiced understanding, gratitude, and agreed with plan.

## 2017-08-10 ENCOUNTER — Encounter: Payer: Self-pay | Admitting: Obstetrics and Gynecology

## 2017-11-04 ENCOUNTER — Ambulatory Visit (INDEPENDENT_AMBULATORY_CARE_PROVIDER_SITE_OTHER): Payer: BLUE CROSS/BLUE SHIELD | Admitting: Certified Nurse Midwife

## 2017-11-04 ENCOUNTER — Encounter: Payer: Self-pay | Admitting: Certified Nurse Midwife

## 2017-11-04 VITALS — BP 137/93 | Temp 99.3°F | Ht 63.0 in | Wt 277.0 lb

## 2017-11-04 DIAGNOSIS — N941 Unspecified dyspareunia: Secondary | ICD-10-CM | POA: Diagnosis not present

## 2017-11-04 DIAGNOSIS — Z01411 Encounter for gynecological examination (general) (routine) with abnormal findings: Secondary | ICD-10-CM

## 2017-11-04 DIAGNOSIS — Z01419 Encounter for gynecological examination (general) (routine) without abnormal findings: Secondary | ICD-10-CM

## 2017-11-04 DIAGNOSIS — Z30011 Encounter for initial prescription of contraceptive pills: Secondary | ICD-10-CM | POA: Diagnosis not present

## 2017-11-04 DIAGNOSIS — I10 Essential (primary) hypertension: Secondary | ICD-10-CM | POA: Insufficient documentation

## 2017-11-04 DIAGNOSIS — Z113 Encounter for screening for infections with a predominantly sexual mode of transmission: Secondary | ICD-10-CM | POA: Diagnosis not present

## 2017-11-04 DIAGNOSIS — Z124 Encounter for screening for malignant neoplasm of cervix: Secondary | ICD-10-CM | POA: Diagnosis not present

## 2017-11-04 DIAGNOSIS — N898 Other specified noninflammatory disorders of vagina: Secondary | ICD-10-CM | POA: Diagnosis not present

## 2017-11-04 MED ORDER — OSPEMIFENE 60 MG PO TABS: 1.0000 | ORAL_TABLET | Freq: Every day | ORAL | 12 refills | Status: AC

## 2017-11-04 MED ORDER — NORGESTIMATE-ETH ESTRADIOL 0.25-35 MG-MCG PO TABS: 1.0000 | ORAL_TABLET | Freq: Every day | ORAL | 11 refills | Status: AC

## 2017-11-04 MED ORDER — HYDROCHLOROTHIAZIDE 25 MG PO TABS: 25.0000 mg | ORAL_TABLET | Freq: Every day | ORAL | 12 refills | Status: AC

## 2017-11-04 MED ORDER — ELAGOLIX SODIUM 150 MG PO TABS: 1.0000 | ORAL_TABLET | Freq: Every day | ORAL | 6 refills | Status: AC

## 2017-11-04 NOTE — Progress Notes (Signed)
Subjective:        Caitlyn Galvan is a 27 y.o. female here for a routine exam.  Current complaints: dyspereunia for severe pain with sexual intercourse that started around the time of her first child, has tried multiple positions and still reports pain.  History of 2 C-sections and a D&C.  Reports normal periods.  Stopped breast feeding 3 months ago. Is taking POP and desires to change to COCs.  Had elevated blood pressure with last pregnancy and has not been on medications for blood pressure management since then.  HCTZ prescribed today.  Reports exercising about 3 days a week for about an hour each session.  Reports 6 day period, wear super plus tampons and changes them about every 2 hours.  Long history of abnormal periods with heavy bleeding.  Denies constipation/diarrhea with periods.   States that her periods were more normalized with Sprintec and desires to restart on Sprintec now that she is not breast feeding.     Personal health questionnaire:  Is patient Ashkenazi Jewish, have a family history of breast and/or ovarian cancer: no Is there a family history of uterine cancer diagnosed at age < 56, gastrointestinal cancer, urinary tract cancer, family member who is a Personnel officer syndrome-associated carrier: no Is the patient overweight and hypertensive, family history of diabetes, personal history of gestational diabetes, preeclampsia or PCOS: yes Is patient over 9, have PCOS,  family history of premature CHD under age 68, diabetes, smoke, have hypertension or peripheral artery disease:  Yes: HTN At any time, has a partner hit, kicked or otherwise hurt or frightened you?: no Over the past 2 weeks, have you felt down, depressed or hopeless?: no Over the past 2 weeks, have you felt little interest or pleasure in doing things?:not asked   Gynecologic History Patient's last menstrual period was 10/12/2017. Contraception: oral progesterone-only contraceptive Last Pap: 08/19/16. Results  were: normal Last mammogram: n/a <40 years and no significant family hx.   Obstetric History OB History  Gravida Para Term Preterm AB Living  3 2 2  0 1 2  SAB TAB Ectopic Multiple Live Births  1 0 0 0 2    # Outcome Date GA Lbr Len/2nd Weight Sex Delivery Anes PTL Lv  3 Term 02/08/17 [redacted]w[redacted]d  7 lb 5.5 oz (3.33 kg) F CS-LTranv EPI  LIV  2 Term 01/26/11    Judie Petit CS-LTranv        Birth Comments: FTP  1 SAB             Past Medical History:  Diagnosis Date  . Anxiety   . Depression   . Headache   . No pertinent past medical history   . Pregnancy induced hypertension     Past Surgical History:  Procedure Laterality Date  . ADENOIDECTOMY    . adnoids    . CESAREAN SECTION    . CESAREAN SECTION N/A 02/08/2017   Procedure: CESAREAN SECTION;  Surgeon: Tereso Newcomer, MD;  Location: [facility];  Service: Obstetrics;  Laterality: N/A;  . DILATION AND CURETTAGE OF UTERUS    . DILATION AND EVACUATION N/A 07/19/2015   Procedure: DILATATION AND EVACUATION;  Surgeon: Mitchel Honour, DO;  Location: [facility];  Service: Gynecology;  Laterality: N/A;  . INCISE AND DRAIN ABCESS    . tubes in ears    . WISDOM TOOTH EXTRACTION       Current Outpatient Medications:  .  norethindrone (MICRONOR,CAMILA,ERRIN) 0.35 MG tablet, Take 1 tablet (  0.35 mg total) by mouth daily., Disp: 1 Package, Rfl: 11 .  Prenat-FeAsp-Meth-FA-DHA w/o A (PRENATE PIXIE) 10-0.6-0.4-200 MG CAPS, Take 1 capsule by mouth daily., Disp: , Rfl:  .  Elagolix Sodium (ORILISSA) 150 MG TABS, Take 1 tablet by mouth daily., Disp: 30 tablet, Rfl: 6 .  hydrochlorothiazide (HYDRODIURIL) 25 MG tablet, Take 1 tablet (25 mg total) by mouth daily., Disp: 30 tablet, Rfl: 12 .  norgestimate-ethinyl estradiol (ORTHO-CYCLEN,SPRINTEC,PREVIFEM) 0.25-35 MG-MCG tablet, Take 1 tablet by mouth daily., Disp: 1 Package, Rfl: 11 .  Ospemifene (OSPHENA) 60 MG TABS, Take 1 tablet by mouth daily., Disp: 30 tablet, Rfl: 12 No Known Allergies  Social History    Tobacco Use  . Smoking status: Former Smoker    Packs/day: 0.50    Years: 2.00    Pack years: 1.00    Types: Cigarettes, E-cigarettes    Last attempt to quit: 06/16/2014    Years since quitting: 3.3  . Smokeless tobacco: Former Neurosurgeon  . Tobacco comment: 1/2 pack a week  Substance Use Topics  . Alcohol use: No    Alcohol/week: 0.0 oz    Comment: occasionally; not while preg    Family History  Problem Relation Age of Onset  . Depression Mother   . Diabetes Mother   . Hypertension Father   . Stroke Father   . Asthma Sister   . Depression Sister   . Hypertension Sister   . Mental retardation Sister   . Arthritis Maternal Aunt   . Asthma Maternal Aunt   . Diabetes Paternal Grandmother   . Alcohol abuse Neg Hx   . Birth defects Neg Hx   . Cancer Neg Hx   . COPD Neg Hx   . Drug abuse Neg Hx   . Early death Neg Hx   . Hearing loss Neg Hx   . Hyperlipidemia Neg Hx   . Heart disease Neg Hx   . Kidney disease Neg Hx   . Learning disabilities Neg Hx   . Mental illness Neg Hx   . Miscarriages / Stillbirths Neg Hx   . Vision loss Neg Hx   . Varicose Veins Neg Hx       Review of Systems  Constitutional: negative for fatigue and weight loss Respiratory: negative for cough and wheezing Cardiovascular: negative for chest pain, fatigue and palpitations Gastrointestinal: negative for abdominal pain and change in bowel habits Musculoskeletal:negative for myalgias Neurological: negative for gait problems and tremors Behavioral/Psych: negative for abusive relationship, depression Endocrine: negative for temperature intolerance    Genitourinary:negative for abnormal menstrual periods, genital lesions, hot flashes, + for sexual problems and vaginal discharge.  Integument/breast: negative for breast lump, breast tenderness, nipple discharge and skin lesion(s)    Objective:       BP (!) 137/93   Temp 99.3 F (37.4 C)   Ht 5\' 3"  (1.6 m)   Wt 277 lb (125.6 kg)   LMP  10/12/2017   BMI 49.07 kg/m  General:   alert  Skin:   no rash or abnormalities  Lungs:   clear to auscultation bilaterally  Heart:   regular rate and rhythm, S1, S2 normal, no murmur, click, rub or gallop  Breasts:   normal without suspicious masses, skin or nipple changes or axillary nodes  Abdomen:  normal findings: no organomegaly, soft, non-tender and no hernia  Pelvis:  External genitalia: normal general appearance Urinary system: urethral meatus normal and bladder without fullness, nontender Vaginal: normal without tenderness, induration or masses, absent  rugae, minimal discharge noted Cervix: normal appearance Adnexa: normal bimanual exam Uterus: anteverted and non-tender, normal size   Lab Review Urine pregnancy test Labs reviewed yes Radiologic studies reviewed no  50% of 45 min visit spent on counseling and coordination of care.   Assessment & Plan    Healthy female exam.    1. Well woman exam    - Cytology - PAP - CBC with Differential/Platelet - Comprehensive metabolic panel - TSH - Cholesterol, total - Triglycerides - HDL cholesterol  2. Morbid obesity (HCC)     Abnormal triglycerides/HDL, needs f/u with PCP - TSH - Hemoglobin A1c  3. Encounter for initial prescription of contraceptive pills    - norgestimate-ethinyl estradiol (ORTHO-CYCLEN,SPRINTEC,PREVIFEM) 0.25-35 MG-MCG tablet; Take 1 tablet by mouth daily.  Dispense: 1 Package; Refill: 11  4. Essential hypertension    - hydrochlorothiazide (HYDRODIURIL) 25 MG tablet; Take 1 tablet (25 mg total) by mouth daily.  Dispense: 30 tablet; Refill: 12  5. Dyspareunia, female      ?cause of scar tissue - Ospemifene (OSPHENA) 60 MG TABS; Take 1 tablet by mouth daily.  Dispense: 30 tablet; Refill: 12 - Elagolix Sodium (ORILISSA) 150 MG TABS; Take 1 tablet by mouth daily.  Dispense: 30 tablet; Refill: 6 - Cervicovaginal ancillary only     7. Vaginal discharge    - Cervicovaginal ancillary only    Education reviewed: calcium supplements, depression evaluation, low fat, low cholesterol diet, safe sex/STD prevention, self breast exams, skin cancer screening and weight bearing exercise. Contraception: OCP (estrogen/progesterone). Follow up in: 3 months.   Meds ordered this encounter  Medications  . Ospemifene (OSPHENA) 60 MG TABS    Sig: Take 1 tablet by mouth daily.    Dispense:  30 tablet    Refill:  12  . Elagolix Sodium (ORILISSA) 150 MG TABS    Sig: Take 1 tablet by mouth daily.    Dispense:  30 tablet    Refill:  6  . norgestimate-ethinyl estradiol (ORTHO-CYCLEN,SPRINTEC,PREVIFEM) 0.25-35 MG-MCG tablet    Sig: Take 1 tablet by mouth daily.    Dispense:  1 Package    Refill:  11  . hydrochlorothiazide (HYDRODIURIL) 25 MG tablet    Sig: Take 1 tablet (25 mg total) by mouth daily.    Dispense:  30 tablet    Refill:  12   Orders Placed This Encounter  Procedures  . CBC with Differential/Platelet  . Comprehensive metabolic panel  . TSH  . Cholesterol, total  . Triglycerides  . HDL cholesterol  . Hemoglobin A1c    Possible management options include: Pelvic US, Abdominal Lap Follow up as needed.

## 2017-11-04 NOTE — Progress Notes (Signed)
Pt presents for annual and pap. STD offered pt declined. Pt c/o R lower abdominal pain for "years." Dyspareunia x 5 yrs per pt.

## 2017-11-05 ENCOUNTER — Encounter: Payer: Self-pay | Admitting: Certified Nurse Midwife

## 2017-11-05 LAB — COMPREHENSIVE METABOLIC PANEL
ALBUMIN: 4.5 g/dL (ref 3.5–5.5)
ALK PHOS: 68 IU/L (ref 39–117)
ALT: 108 IU/L — ABNORMAL HIGH (ref 0–32)
AST: 54 IU/L — AB (ref 0–40)
Albumin/Globulin Ratio: 1.8 (ref 1.2–2.2)
BILIRUBIN TOTAL: 0.3 mg/dL (ref 0.0–1.2)
BUN / CREAT RATIO: 12 (ref 9–23)
BUN: 8 mg/dL (ref 6–20)
CHLORIDE: 104 mmol/L (ref 96–106)
CO2: 23 mmol/L (ref 20–29)
Calcium: 9.3 mg/dL (ref 8.7–10.2)
Creatinine, Ser: 0.66 mg/dL (ref 0.57–1.00)
GFR calc Af Amer: 142 mL/min/{1.73_m2} (ref >59)
GFR calc non Af Amer: 123 mL/min/{1.73_m2} (ref >59)
Globulin, Total: 2.5 g/dL (ref 1.5–4.5)
Glucose: 87 mg/dL (ref 65–99)
Potassium: 4.5 mmol/L (ref 3.5–5.2)
Sodium: 141 mmol/L (ref 134–144)
Total Protein: 7 g/dL (ref 6.0–8.5)

## 2017-11-05 LAB — CBC WITH DIFFERENTIAL/PLATELET
BASOS ABS: 0 10*3/uL (ref 0.0–0.2)
Basos: 0 %
EOS (ABSOLUTE): 0.1 10*3/uL (ref 0.0–0.4)
Eos: 1 %
Hematocrit: 44 % (ref 34.0–46.6)
Hemoglobin: 14.1 g/dL (ref 11.1–15.9)
Immature Grans (Abs): 0 10*3/uL (ref 0.0–0.1)
Immature Granulocytes: 0 %
Lymphocytes Absolute: 1.4 10*3/uL (ref 0.7–3.1)
Lymphs: 21 %
MCH: 28.5 pg (ref 26.6–33.0)
MCHC: 32 g/dL (ref 31.5–35.7)
MCV: 89 fL (ref 79–97)
MONOCYTES: 9 %
MONOS ABS: 0.6 10*3/uL (ref 0.1–0.9)
NEUTROS ABS: 4.6 10*3/uL (ref 1.4–7.0)
Neutrophils: 69 %
PLATELETS: 276 10*3/uL (ref 150–379)
RBC: 4.94 x10E6/uL (ref 3.77–5.28)
RDW: 14.3 % (ref 12.3–15.4)
WBC: 6.7 10*3/uL (ref 3.4–10.8)

## 2017-11-05 LAB — CERVICOVAGINAL ANCILLARY ONLY
Bacterial vaginitis: NEGATIVE
CANDIDA VAGINITIS: POSITIVE — AB
CHLAMYDIA, DNA PROBE: NEGATIVE
NEISSERIA GONORRHEA: NEGATIVE
TRICH (WINDOWPATH): NEGATIVE

## 2017-11-05 LAB — HDL CHOLESTEROL: HDL: 37 mg/dL — ABNORMAL LOW (ref >39)

## 2017-11-05 LAB — TSH: TSH: 1.28 u[IU]/mL (ref 0.450–4.500)

## 2017-11-05 LAB — HEMOGLOBIN A1C
Est. average glucose Bld gHb Est-mCnc: 97 mg/dL
Hgb A1c MFr Bld: 5 % (ref 4.8–5.6)

## 2017-11-05 LAB — TRIGLYCERIDES: Triglycerides: 176 mg/dL — ABNORMAL HIGH (ref 0–149)

## 2017-11-05 LAB — CHOLESTEROL, TOTAL: CHOLESTEROL TOTAL: 198 mg/dL (ref 100–199)

## 2017-11-06 LAB — CYTOLOGY - PAP: DIAGNOSIS: NEGATIVE

## 2017-11-09 ENCOUNTER — Other Ambulatory Visit: Payer: Self-pay | Admitting: Certified Nurse Midwife

## 2017-11-09 DIAGNOSIS — B373 Candidiasis of vulva and vagina: Secondary | ICD-10-CM

## 2017-11-09 DIAGNOSIS — N939 Abnormal uterine and vaginal bleeding, unspecified: Secondary | ICD-10-CM | POA: Insufficient documentation

## 2017-11-09 DIAGNOSIS — B3731 Acute candidiasis of vulva and vagina: Secondary | ICD-10-CM

## 2017-11-09 MED ORDER — TERCONAZOLE 0.8 % VA CREA
1.0000 | TOPICAL_CREAM | Freq: Every day | VAGINAL | 0 refills | Status: AC
Start: 2017-11-09 — End: ?

## 2017-11-09 MED ORDER — FLUCONAZOLE 200 MG PO TABS
200.0000 mg | ORAL_TABLET | Freq: Once | ORAL | 0 refills | Status: AC
Start: 2017-11-09 — End: 2017-11-09

## 2017-11-14 ENCOUNTER — Encounter: Payer: Self-pay | Admitting: Certified Nurse Midwife

## 2017-11-17 ENCOUNTER — Other Ambulatory Visit: Payer: Self-pay | Admitting: Certified Nurse Midwife

## 2017-11-18 ENCOUNTER — Ambulatory Visit (INDEPENDENT_AMBULATORY_CARE_PROVIDER_SITE_OTHER): Payer: BLUE CROSS/BLUE SHIELD

## 2017-11-18 DIAGNOSIS — Z3202 Encounter for pregnancy test, result negative: Secondary | ICD-10-CM

## 2017-11-18 DIAGNOSIS — Z3200 Encounter for pregnancy test, result unknown: Secondary | ICD-10-CM

## 2017-11-18 LAB — POCT URINE PREGNANCY: Preg Test, Ur: NEGATIVE

## 2017-11-18 NOTE — Progress Notes (Signed)
Agree with A & P. 

## 2017-11-18 NOTE — Progress Notes (Signed)
Patient is in the office for UPT, results negative. Pt stated that she also had a negative test at home but that her cycle is a few days late and wanted to make sure. Pt is waiting to start BC pills, pt stated she would follow up, if cycle does not start. LMP was middle of March.

## 2017-12-02 ENCOUNTER — Other Ambulatory Visit: Payer: Self-pay | Admitting: Certified Nurse Midwife

## 2017-12-04 ENCOUNTER — Other Ambulatory Visit: Payer: Self-pay | Admitting: Certified Nurse Midwife

## 2017-12-04 DIAGNOSIS — N952 Postmenopausal atrophic vaginitis: Secondary | ICD-10-CM

## 2018-01-27 IMAGING — US US MFM FETAL BPP W/O NON-STRESS
1 series · 12 of 19 positions shown · non-contrast
Comparison: none

[Series 1: us mfm fetal bpp w/o non-stress · 19 acquisitions, 12 frames shown]
[im 1/19]
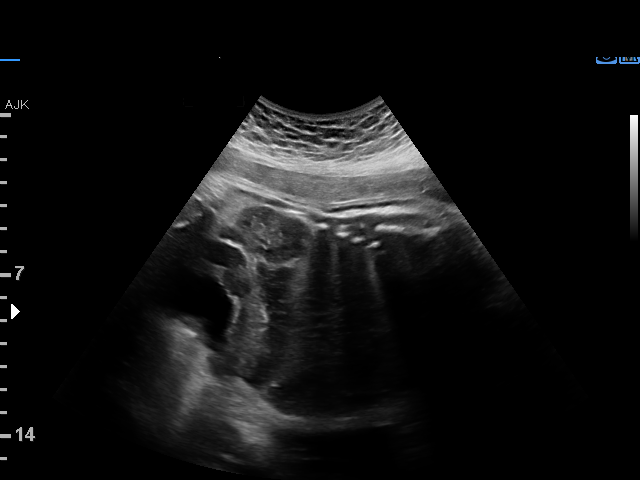
[im 3/19]
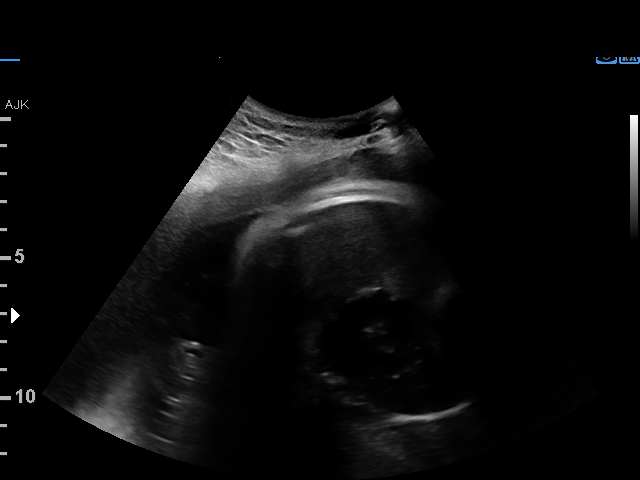
[im 4/19]
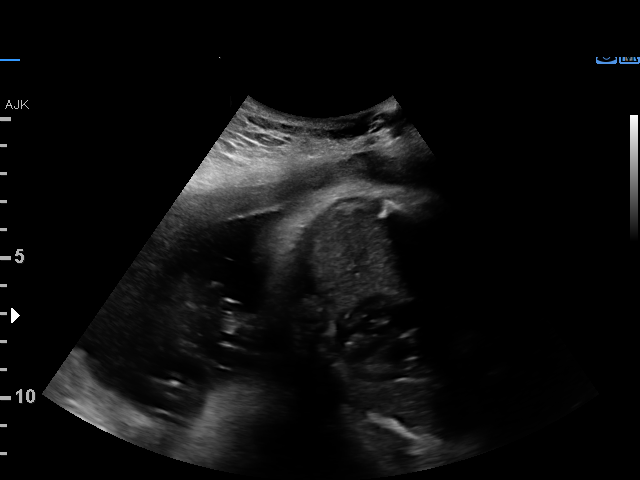
[im 6/19]
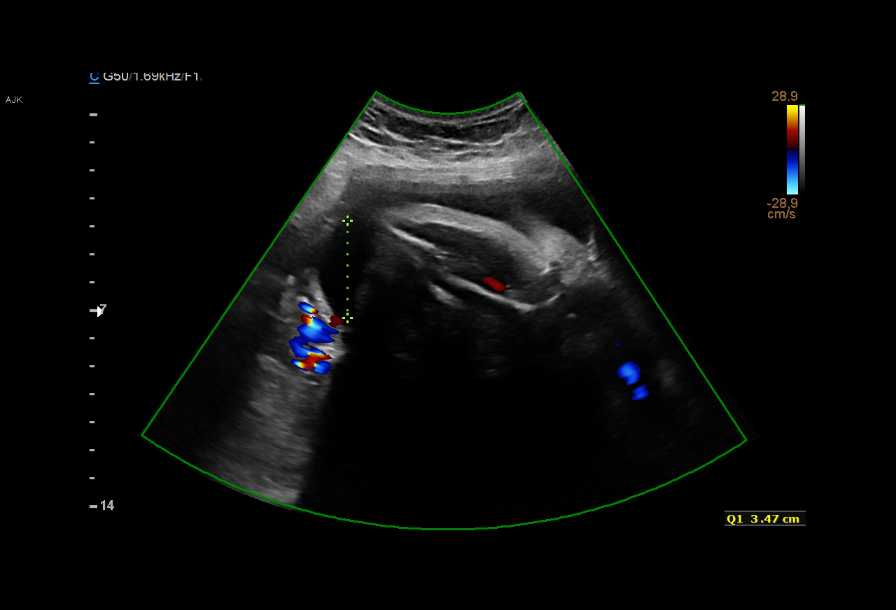
[im 8/19]
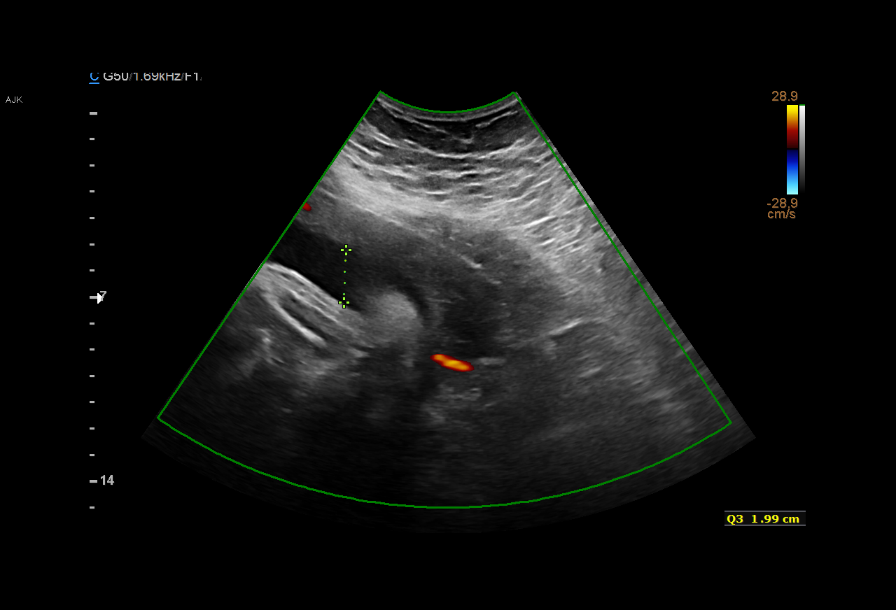
[im 9/19]
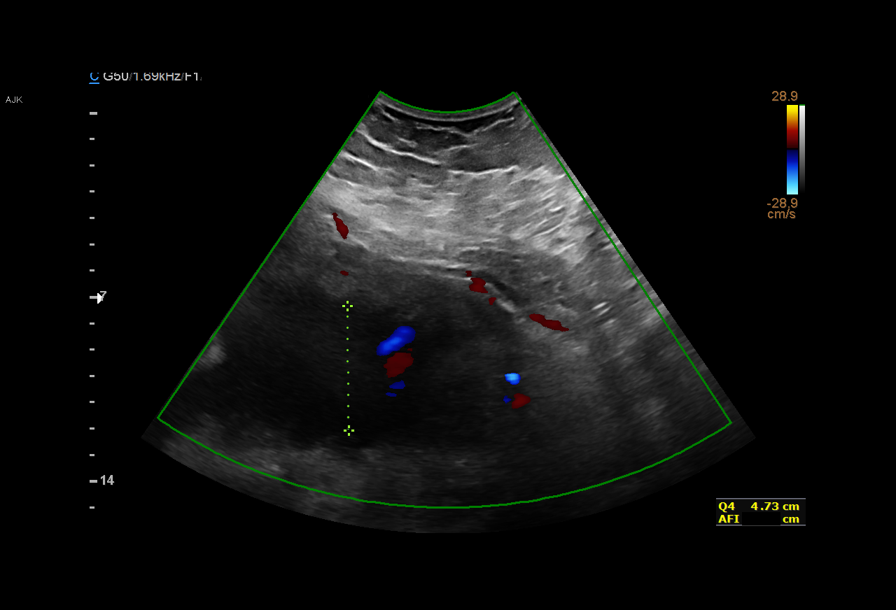
[im 11/19]
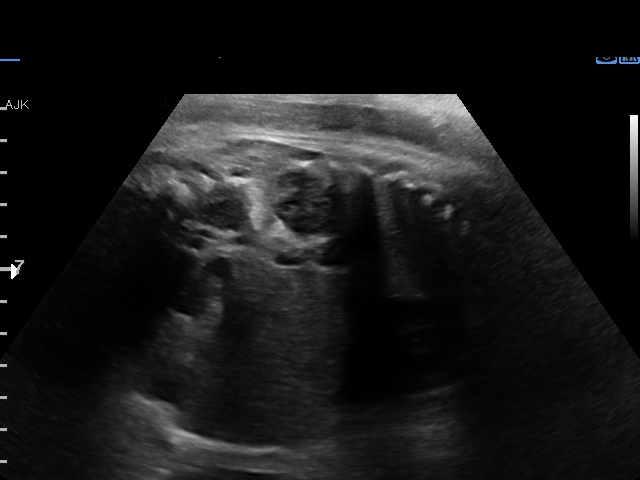
[im 12/19]
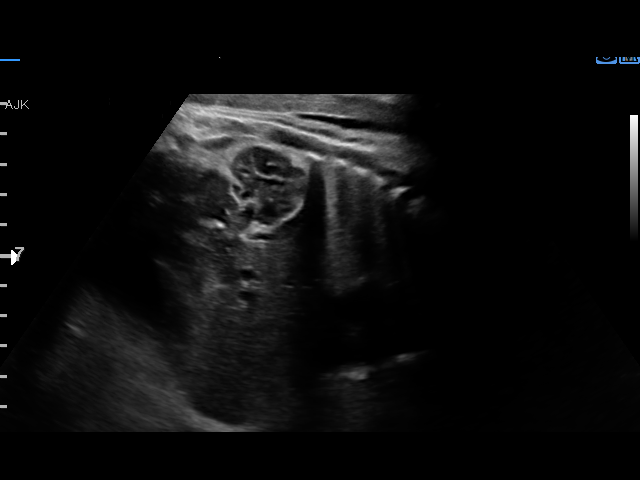
[im 14/19]
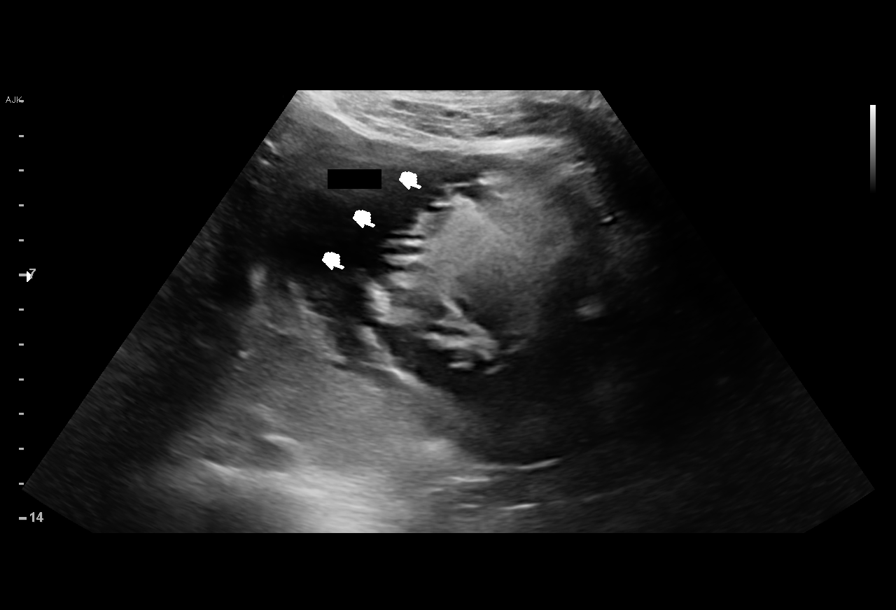
[im 16/19]
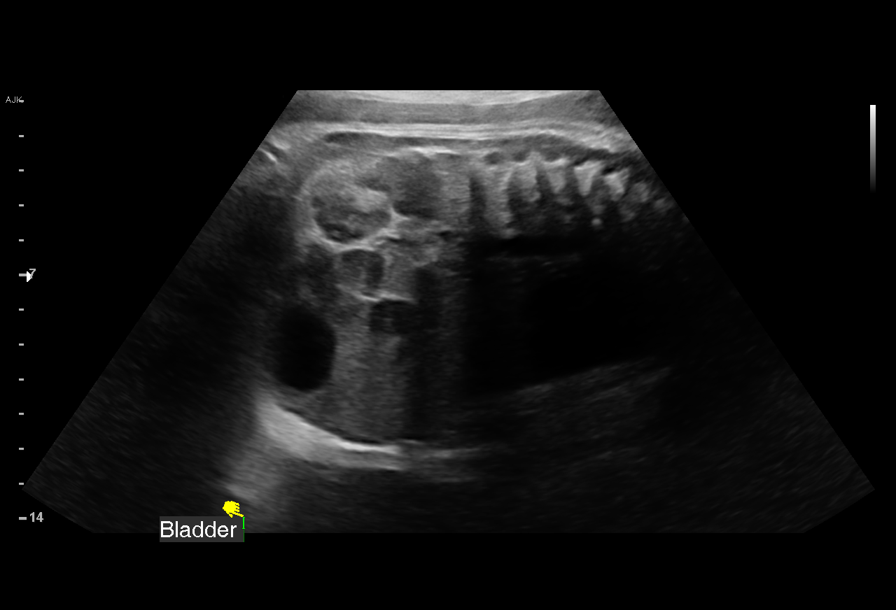
[im 17/19]
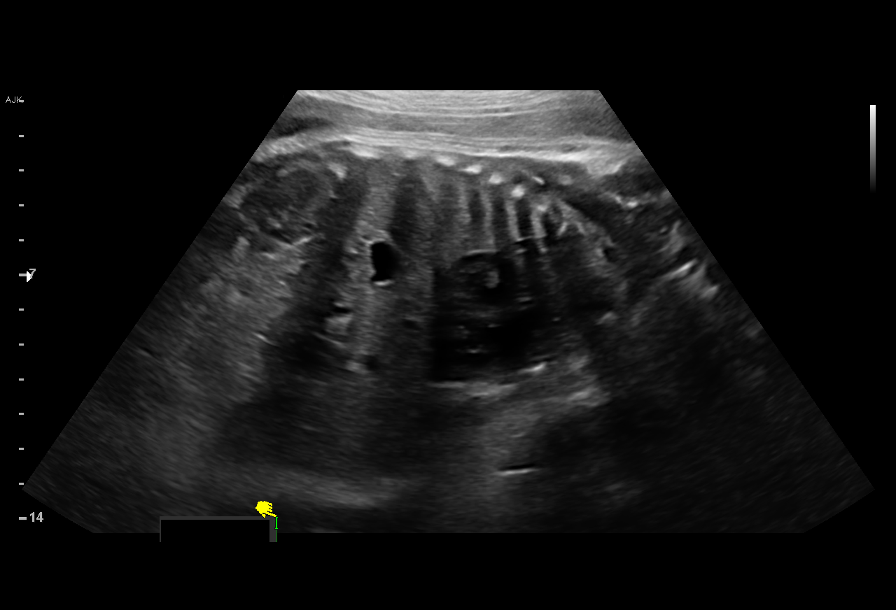
[im 19/19]
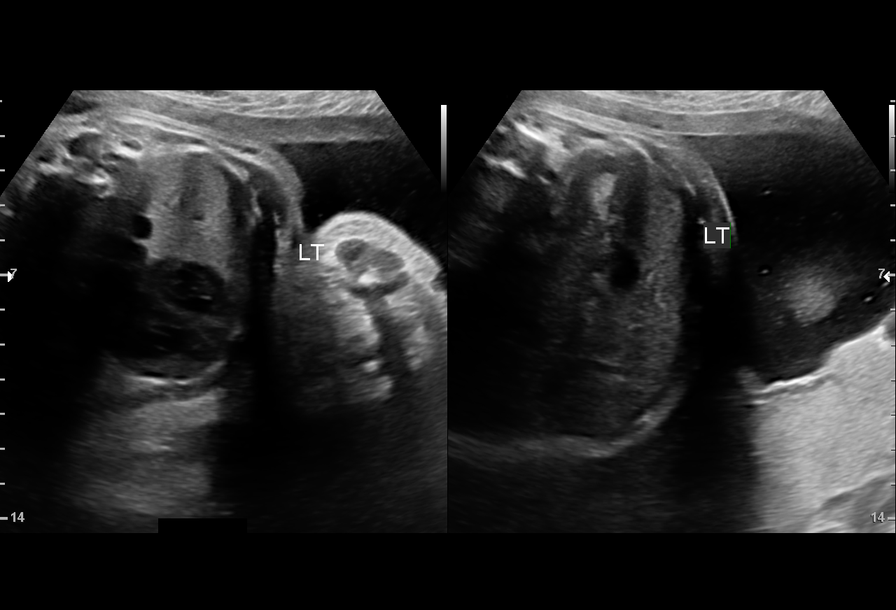

[12 of 19 positions shown; findings below may reference images not displayed]

Road [HOSPITAL]

[address]  [address]           [id number]      [id number]     [id number]
Indications

35 weeks gestation of pregnancy
Previous cesarean delivery, antepartum
Obesity complicating pregnancy, third
trimester
Hypertension - Gestational
OB History

Blood Type:            Height:  5'3"   Weight (lb):  233      BMI:
Gravidity:    3         Term:   1        Prem:   0        SAB:   1
TOP:          0       Ectopic:  0        Living: 1
Fetal Evaluation

Num Of Fetuses:     1
Fetal Heart         155
Rate(bpm):
Cardiac Activity:   Observed
Presentation:       Cephalic
Placenta:           Posterior, above cervical os
P. Cord Insertion:  Previously Visualized

Amniotic Fluid
AFI FV:      Subjectively within normal limits

AFI Sum(cm)     %Tile       Largest Pocket(cm)
16.57           61

RUQ(cm)       RLQ(cm)       LUQ(cm)        LLQ(cm)
3.47
Biophysical Evaluation

Amniotic F.V:   Within normal limits       F. Tone:        Observed
F. Movement:    Observed                   Score:          [DATE]
F. Breathing:   Observed
Gestational Age

LMP:           35w 5d       Date:   05/23/16                 EDD:   02/27/17
Best:          35w 5d    Det. By:   LMP  (05/23/16)          EDD:   02/27/17
Impression

SIUP at 35+5 weeks
Cephalic presentation
Normal amniotic fluid volume
BPP [DATE]
Recommendations

Follow-up as clinically indicated

## 2018-02-02 IMAGING — US US MFM FETAL BPP W/O NON-STRESS
1 series · 12 of 24 positions shown · non-contrast
Comparison: none

[Series 1: us mfm fetal bpp w/o non-stress · 24 acquisitions, 12 frames shown]
[im 2/24]
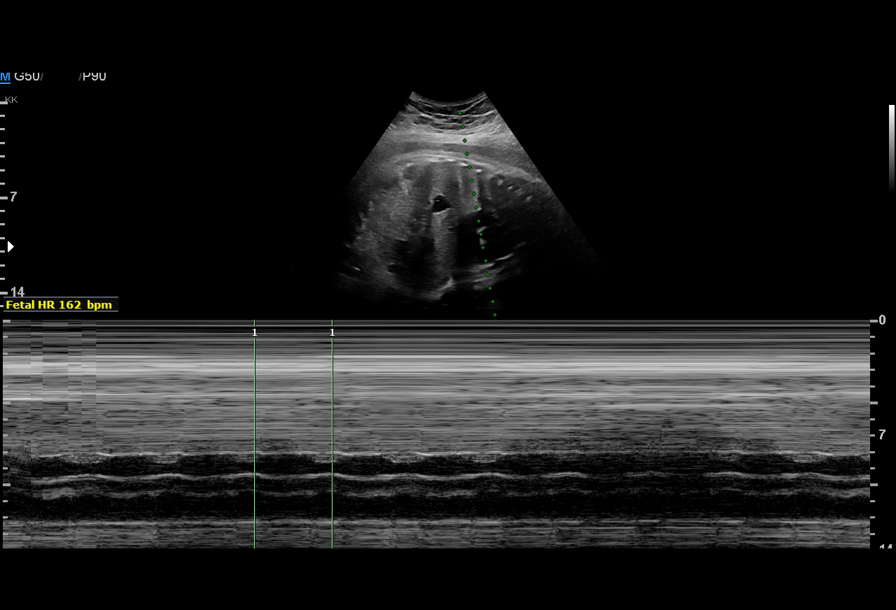
[im 4/24]
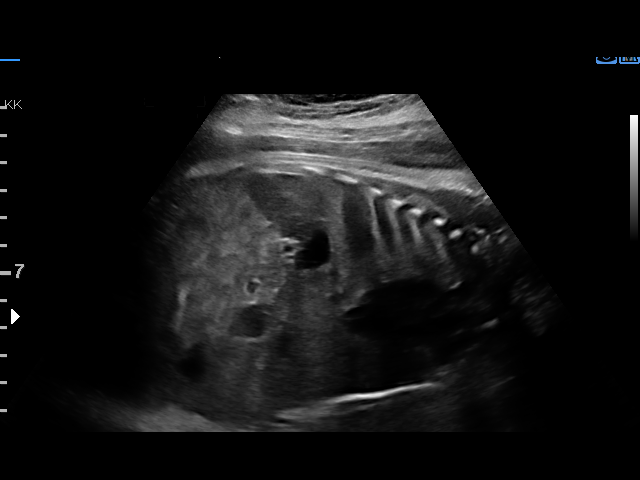
[im 6/24]
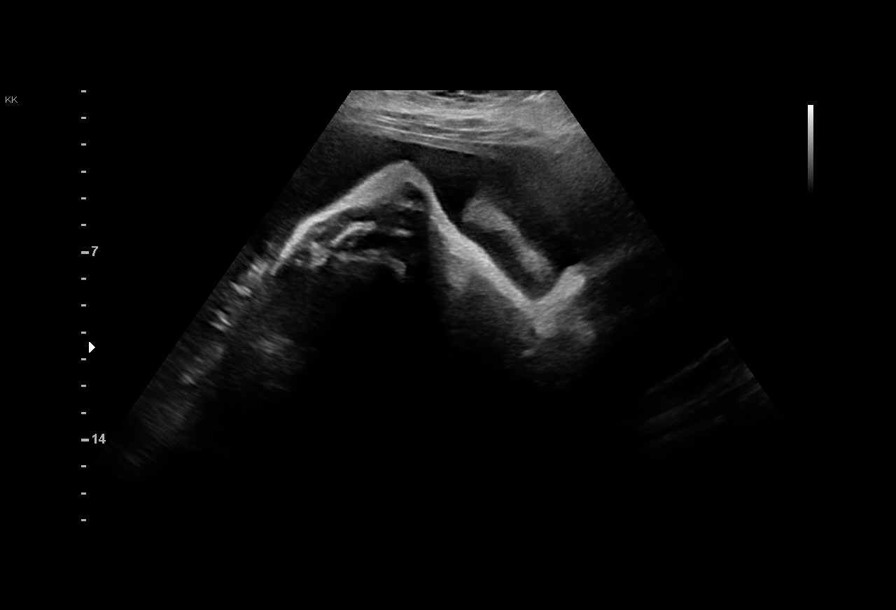
[im 8/24]
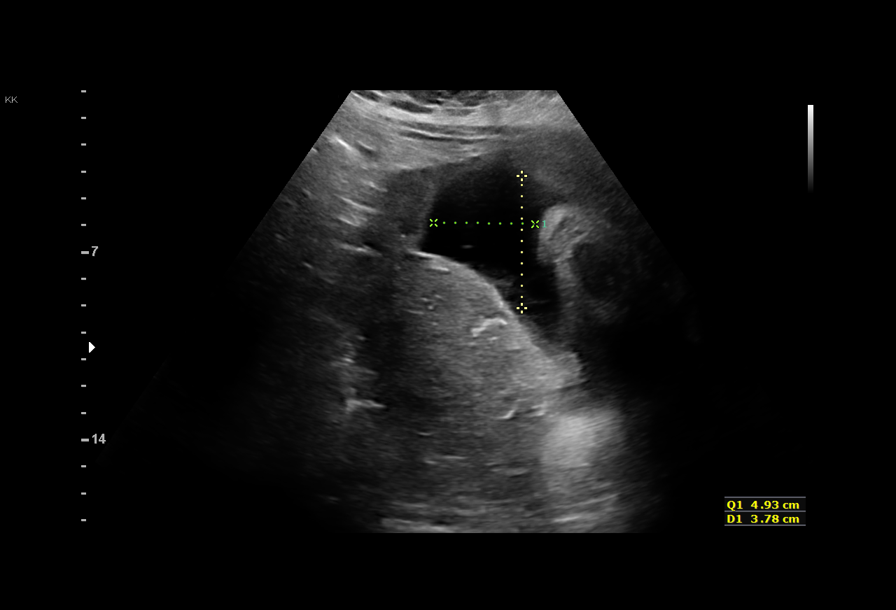
[im 10/24]
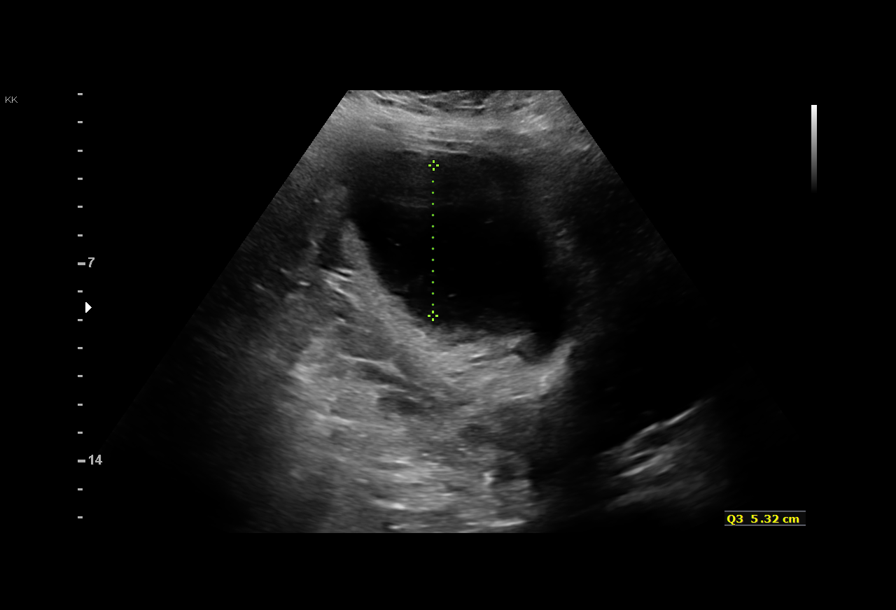
[im 12/24]
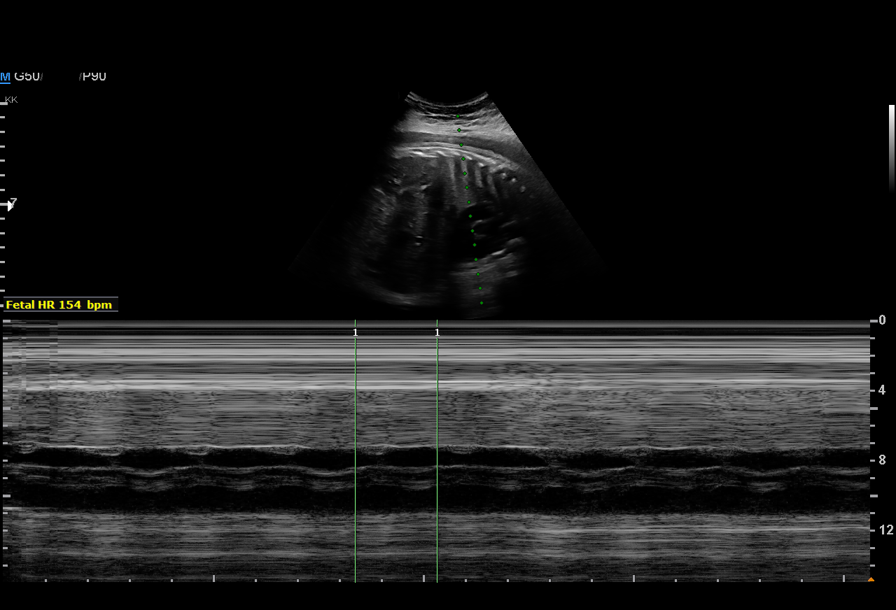
[im 14/24]
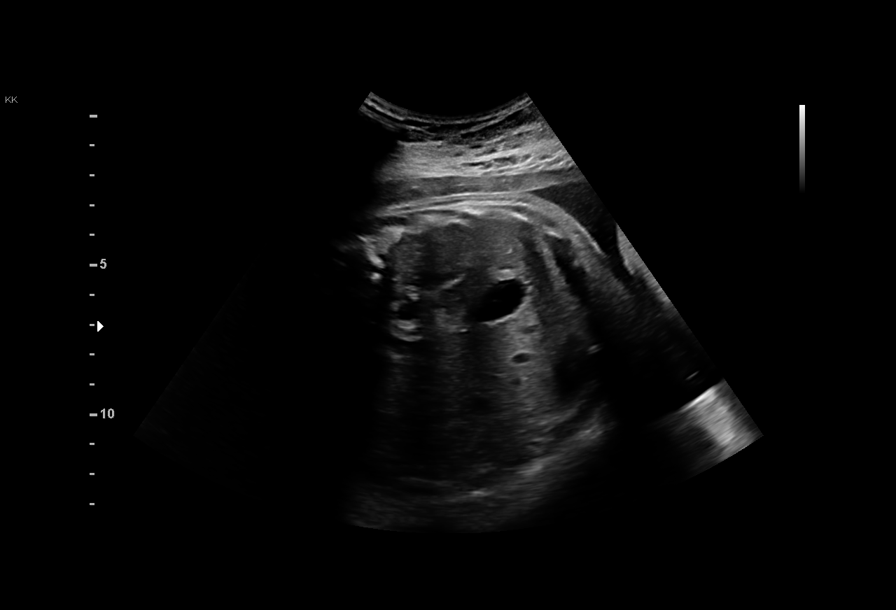
[im 16/24]
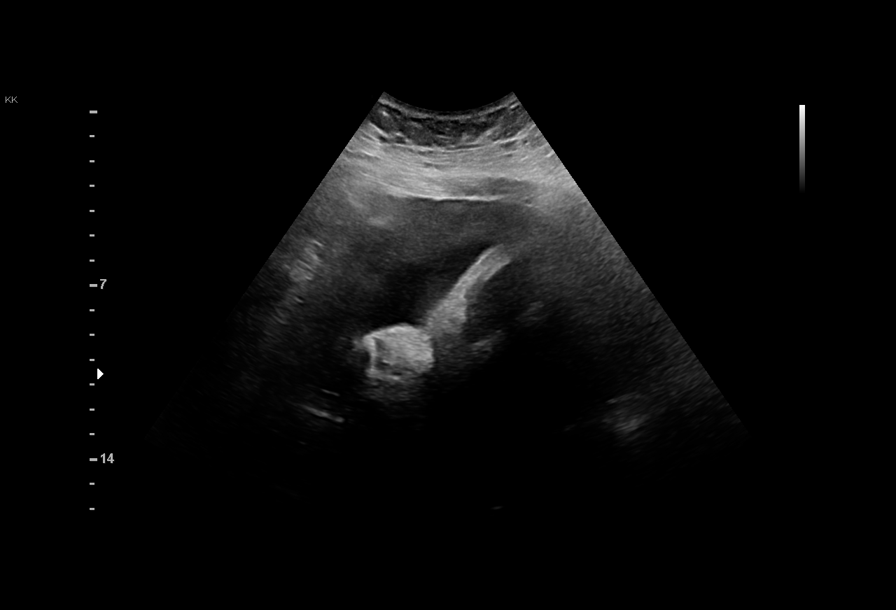
[im 18/24]
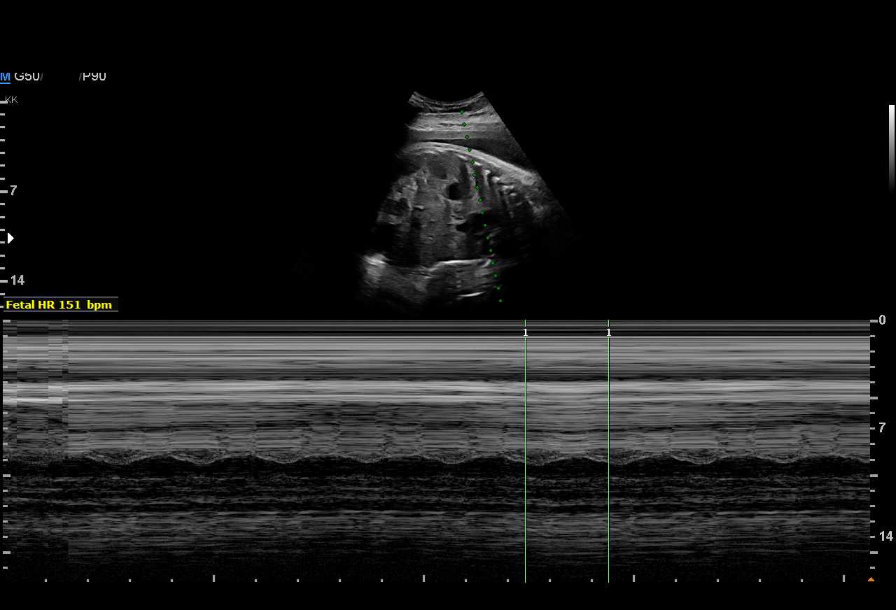
[im 20/24]
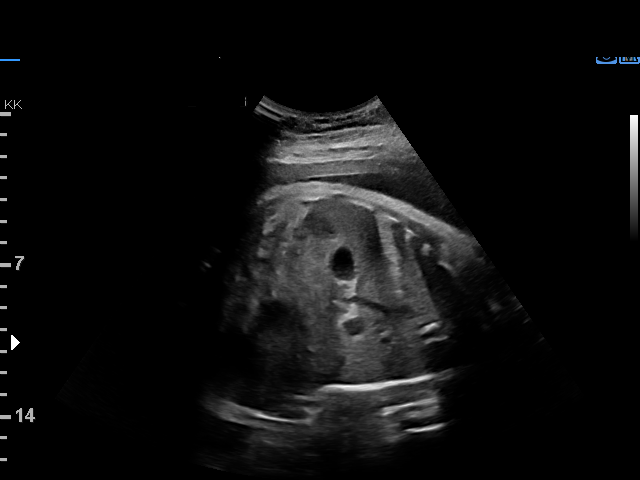
[im 22/24]
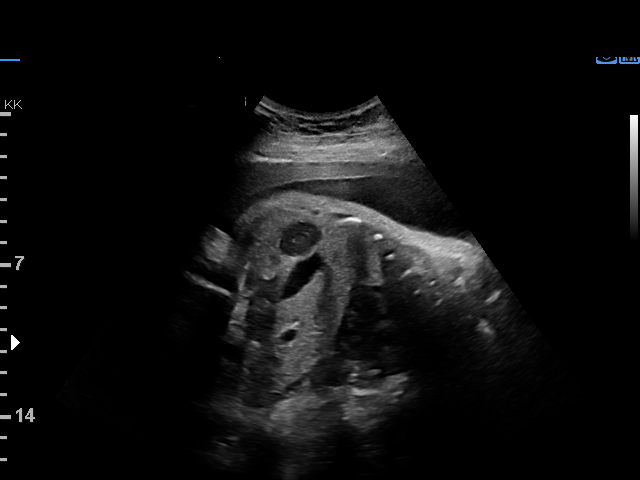
[im 24/24]
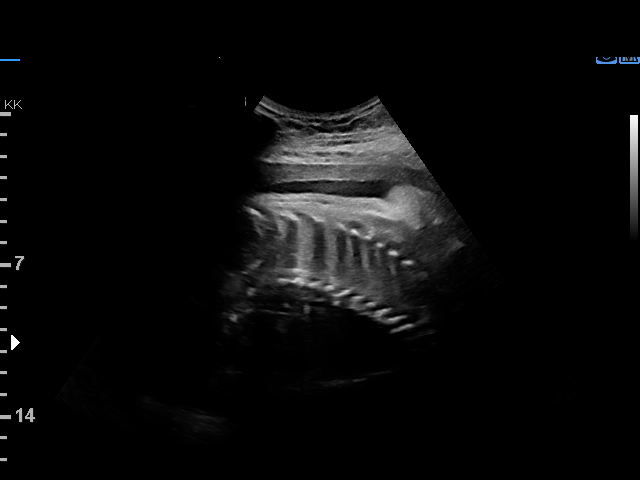

[12 of 24 positions shown; findings below may reference images not displayed]

Road [[email or web address]]

Indications

36 weeks gestation of pregnancy
Previous cesarean delivery, antepartum
Obesity complicating pregnancy, third
trimester
Hypertension - Gestational
OB History

Blood Type:            Height:  5'3"   Weight (lb):  233       BMI:
Gravidity:    3         Term:   1        Prem:   0        SAB:   1
TOP:          0       Ectopic:  0        Living: 1
Fetal Evaluation

Num Of Fetuses:     1
Fetal Heart         162
Rate(bpm):
Cardiac Activity:   Observed
Presentation:       Cephalic

Amniotic Fluid
AFI FV:      Subjectively within normal limits

AFI Sum(cm)     %Tile       Largest Pocket(cm)
17.72           67

RUQ(cm)       RLQ(cm)       LUQ(cm)        LLQ(cm)
4.93
Biophysical Evaluation
Amniotic F.V:   Pocket => 2 cm two         F. Tone:        Observed
planes
F. Movement:    Observed                   Score:          [DATE]
F. Breathing:   Observed
Gestational Age

LMP:           36w 4d        Date:  05/23/16                 EDD:   02/27/17
Best:          36w 4d     Det. By:  LMP  (05/23/16)          EDD:   02/27/17
Impression

IUP at  36+4 with GHTN
Normal amniotic fluid volume
BPP [DATE]
Recommendations

Continue antepartum testing regimen

## 2018-05-03 ENCOUNTER — Encounter: Payer: Self-pay | Admitting: *Deleted
